# Patient Record
Sex: Male | Born: 1950
Health system: Southern US, Community
[De-identification: ages and names within clinical notes are randomized; demographics above are authoritative.]

## PROBLEM LIST (undated history)

## (undated) DIAGNOSIS — E7800 Pure hypercholesterolemia, unspecified: Secondary | ICD-10-CM

## (undated) DIAGNOSIS — I10 Essential (primary) hypertension: Secondary | ICD-10-CM

## (undated) DIAGNOSIS — K648 Other hemorrhoids: Secondary | ICD-10-CM

## (undated) DIAGNOSIS — K635 Polyp of colon: Secondary | ICD-10-CM

## (undated) DIAGNOSIS — I2510 Atherosclerotic heart disease of native coronary artery without angina pectoris: Secondary | ICD-10-CM

## (undated) DIAGNOSIS — K5790 Diverticulosis of intestine, part unspecified, without perforation or abscess without bleeding: Secondary | ICD-10-CM

## (undated) DIAGNOSIS — Z87442 Personal history of urinary calculi: Secondary | ICD-10-CM

## (undated) HISTORY — DX: Diverticulosis of intestine, part unspecified, without perforation or abscess without bleeding: K57.90

## (undated) HISTORY — DX: Polyp of colon: K63.5

## (undated) HISTORY — DX: Other hemorrhoids: K64.8

---

## 2011-09-18 ENCOUNTER — Ambulatory Visit (INDEPENDENT_AMBULATORY_CARE_PROVIDER_SITE_OTHER): Payer: BC Managed Care – PPO | Admitting: Family Medicine

## 2011-09-18 VITALS — BP 170/94 | HR 85 | Temp 98.8°F | Resp 16 | Ht 67.5 in | Wt 233.0 lb

## 2011-09-18 DIAGNOSIS — R21 Rash and other nonspecific skin eruption: Secondary | ICD-10-CM

## 2011-09-18 LAB — POCT CBC
Granulocyte percent: 57.2 %G (ref 37–80)
HCT, POC: 47 % (ref 43.5–53.7)
Hemoglobin: 15.1 g/dL (ref 14.1–18.1)
POC Granulocyte: 4.5 (ref 2–6.9)
RDW, POC: 13.1 %

## 2011-09-18 LAB — POCT SKIN KOH: Skin KOH, POC: NEGATIVE

## 2011-09-18 MED ORDER — TRIAMCINOLONE ACETONIDE 0.1 % EX CREA: TOPICAL_CREAM | Freq: Two times a day (BID) | CUTANEOUS | Status: AC

## 2011-09-18 NOTE — Patient Instructions (Addendum)
Work hard on weight loss.  Monitor your blood pressure to make sure it stays below 140/90.

## 2011-09-18 NOTE — Progress Notes (Signed)
Subjective: 61 year old man who has been having a rash on his lower legs for the last 10 days. It is fairly asymptomatic. He has red areas on the middle portion of both ankles, as [place] is little dots on his feet and legs. He did get a tick bite a few weeks back, but has not been sick with a fever or any systemic type of. He feels fine.  Objective: Tiny little red dots on both legs, left worse than the right. Around the medial malleolus of both feet is a round plaque-like lesion that head is very well redness. There is mild blanching of this area. No rash on the soles of the feet.  Assessment: Atypical dermatitis  Plan: Do a skin scraping and check a CBC to be sure his platelets are okay.  Results for orders placed in visit on 09/18/11  POCT CBC      Component Value Range   WBC 7.9  4.6 - 10.2 K/uL   Lymph, poc 2.6  0.6 - 3.4   POC LYMPH PERCENT 33.1  10 - 50 %L   MID (cbc) 0.8  0 - 0.9   POC MID % 9.7  0 - 12 %M   POC Granulocyte 4.5  2 - 6.9   Granulocyte percent 57.2  37 - 80 %G   RBC 4.79  4.69 - 6.13 M/uL   Hemoglobin 15.1  14.1 - 18.1 g/dL   HCT, POC 54.0  98.1 - 53.7 %   MCV 98.2 (*) 80 - 97 fL   MCH, POC 31.5 (*) 27 - 31.2 pg   MCHC 32.1  31.8 - 35.4 g/dL   RDW, POC 19.1     Platelet Count, POC 270  142 - 424 K/uL   MPV 9.4  0 - 99.8 fL  POCT SKIN KOH      Component Value Range   Skin KOH, POC Negative     With normal skin scraping and platelet count, believe this is just a nonspecific allergic dermatitis. Will treat with triamcinolone cream.  Patient is to monitor his blood pressure and if it continues to run over 140/90 he is to come back for further assessment.

## 2013-09-28 DIAGNOSIS — Z0271 Encounter for disability determination: Secondary | ICD-10-CM

## 2017-11-11 ENCOUNTER — Encounter: Payer: Self-pay | Admitting: Internal Medicine

## 2017-11-11 ENCOUNTER — Ambulatory Visit (INDEPENDENT_AMBULATORY_CARE_PROVIDER_SITE_OTHER): Payer: Medicare Other | Admitting: Internal Medicine

## 2017-11-11 VITALS — BP 146/90 | HR 84 | Ht 69.0 in | Wt 235.2 lb

## 2017-11-11 VITALS — BP 148/98 | HR 75 | Ht 69.0 in | Wt 234.0 lb

## 2017-11-11 DIAGNOSIS — R079 Chest pain, unspecified: Secondary | ICD-10-CM

## 2017-11-11 DIAGNOSIS — Z131 Encounter for screening for diabetes mellitus: Secondary | ICD-10-CM

## 2017-11-11 DIAGNOSIS — Z125 Encounter for screening for malignant neoplasm of prostate: Secondary | ICD-10-CM

## 2017-11-11 DIAGNOSIS — I200 Unstable angina: Secondary | ICD-10-CM | POA: Diagnosis not present

## 2017-11-11 DIAGNOSIS — R030 Elevated blood-pressure reading, without diagnosis of hypertension: Secondary | ICD-10-CM | POA: Diagnosis not present

## 2017-11-11 DIAGNOSIS — Z13220 Encounter for screening for lipoid disorders: Secondary | ICD-10-CM | POA: Diagnosis not present

## 2017-11-11 NOTE — Patient Instructions (Signed)
We have made you an appointment with Dr Debara Pickett today at 2:45pm.    Please take it easy today.

## 2017-11-11 NOTE — Progress Notes (Signed)
   Steven Larsen 67 y.o. [birth year] [id number]  Assessment & Plan:   Encounter Diagnoses  Name Primary?  . Exertional chest pain - burning Yes  . Elevated blood-pressure reading without diagnosis of hypertension     This sounds like exertional angina to me.  He needs a cardiology evaluation and Dr. Dorris Carnes is kindly help me arrange for an appointment with Dr. Debara Pickett today, this afternoon.  I did go ahead and give him 325 mg of aspirin today though I do not think he has an acute coronary syndrome I thought it could not hurt.  Further plans per cardiology.  I appreciate the opportunity to care for this patient. Copy to Dr. Mali Hilty     Subjective:   Chief Complaint: Heartburn  HPI The patient is here with his wife having 1 to 2 weeks of new exertional burning in the mid chest into the neck.  He noticed it when he was mowing with a push mower on a grade.  He is never had this before.  He has had 4 or 5 self-limited episodes relieved by rest in the last 1 to 2 weeks.  He steadfastly denies any rest pain or symptoms at all.  His wife thought he might be having reflux because she seen a little bit of a brownish phlegm on his pillow from some reflux of regurgitation that she sees from time to time but that it was typically clear.  He does not have any problems with eating nausea vomiting sweating dyspnea.  He has not seen a physician in about 5 years.  He used to take a daily 81 mg aspirin but stopped 6 months ago based upon the recent published information.  Mild pedal edema at times.  All other review of systems appears negative at this time as far as GI. No Known Allergies Current Meds  Medication Sig  . OVER THE COUNTER MEDICATION Take by mouth daily. Super Beta Prostate   Past Medical History:  Diagnosis Date  . Kidney stones    History reviewed. No pertinent surgical history. Social History   Social History Narrative   Married second time, 1 son and 3 daughters biologic  and stepchildren   Owner of [employer]   Never smoker   No alcohol   3 caffeinated beverages daily   family history includes Prostate cancer in his father.   Review of Systems As per HPI all other review of systems negative  Objective:   Physical Exam @BP  (!) 146/90   Pulse 84   Ht 5\' 9"  (1.753 m)   Wt 235 lb 4 oz (106.7 kg)   BMI 34.74 kg/m @  General:  Well-developed, well-nourished and in no acute distress he is obese Eyes:  anicteric. Neck:   supple w/o thyromegaly or mass.  Lungs: Clear to auscultation bilaterally. Heart:   S1S2, no rubs, murmurs, gallops.  Carotid without bruits, left carotid upstroke slightly lower than right but seen normal good radial pulses Abdomen: Obese, soft, non-tender, no hepatosplenomegaly, hernia, or mass and BS+.   Lymph:  no cervical or supraclavicular adenopathy. Extremities:   Trace ankle edema, no cyanosis or clubbing Skin   no rash. Neuro:  A&O x 3.  Psych:  appropriate mood and  Affect.

## 2017-11-11 NOTE — Patient Instructions (Signed)
Dr. Debara Pickett recommends that you take Aspirin 81mg  every day.  Your physician recommends that you schedule a follow-up appointment in about 3-4 weeks after heart cath with either MD or NP/PA    [place] [address] Dept: [phone] Loc: [place]  11/11/2017  You are scheduled for a Cardiac Catheterization on Wednesday, September 4 with Dr. Shelva Majestic.  1. Please arrive at the [facility] (Main Entrance A) at [facility]: [address] [place], [place] [address] at 6:30 AM (This time is two hours before your procedure to ensure your preparation). Free valet parking service is available.   Special note: Every effort is made to have your procedure done on time. Please understand that emergencies sometimes delay scheduled procedures.  2. Diet: Do not eat solid foods after midnight.  You may have clear liquids (jello, broth, water, apple juice for example) until 5am upon the day of the procedure.  3. Labs & Chest X-Ray: -- lab work tomorrow August 29th or Friday August 30th - you will need to be fasting  (no food/drink after midnight - water is OK) -- chest x-ray @ [address] - done by Friday August 30th  4. Medication instructions in preparation for your procedure:  On the morning of your procedure, take Aspirin 81mg  and any morning medicines NOT listed above.  You may use sips of water.  5. Plan for one night stay--bring personal belongings. 6. Bring a current list of your medications and current insurance cards. 7. You MUST have a responsible person to drive you home. 8. Someone MUST be with you the first 24 hours after you arrive home or your discharge will be delayed. 9. Please wear clothes that are easy to get on and off and wear slip-on shoes.  Thank you for allowing [place] to care for you!   -- [facility]

## 2017-11-12 ENCOUNTER — Encounter: Payer: Self-pay | Admitting: Internal Medicine

## 2017-11-12 ENCOUNTER — Ambulatory Visit
Admission: RE | Admit: 2017-11-12 | Discharge: 2017-11-12 | Disposition: A | Discharge status: Home / Self Care | Payer: BLUE CROSS/BLUE SHIELD | Source: Ambulatory Visit | Attending: Internal Medicine | Admitting: Internal Medicine

## 2017-11-12 ENCOUNTER — Other Ambulatory Visit: Payer: Self-pay | Admitting: *Deleted

## 2017-11-12 DIAGNOSIS — Z131 Encounter for screening for diabetes mellitus: Secondary | ICD-10-CM | POA: Diagnosis not present

## 2017-11-12 DIAGNOSIS — Z125 Encounter for screening for malignant neoplasm of prostate: Secondary | ICD-10-CM | POA: Diagnosis not present

## 2017-11-12 DIAGNOSIS — R7301 Impaired fasting glucose: Secondary | ICD-10-CM | POA: Diagnosis not present

## 2017-11-12 DIAGNOSIS — Z01818 Encounter for other preprocedural examination: Secondary | ICD-10-CM | POA: Diagnosis not present

## 2017-11-12 DIAGNOSIS — I200 Unstable angina: Secondary | ICD-10-CM | POA: Diagnosis not present

## 2017-11-12 DIAGNOSIS — Z13220 Encounter for screening for lipoid disorders: Secondary | ICD-10-CM | POA: Diagnosis not present

## 2017-11-12 NOTE — Progress Notes (Signed)
OFFICE CONSULT NOTE  Chief Complaint:  Exertional dyspnea, chest discomfort  Primary Care Physician: Patient, No Pcp Per  HPI:  Steven Larsen is a 67 y.o. male who is being seen today for the evaluation of exertional dyspnea, chest discomfort at the request of Dr. Carlean Purl. 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  Recently he was push mowing the yard and noted onset of chest tightness and burning which resolved at rest.  The symptoms then came back when he started working again.  This happened several times and then eventually stopped.  There was originally concern for possible reflux and he was seen by Dr. Arelia Longest with [facility].  Because of the exertional nature of his symptoms, he was concerned that this may represent unstable angina.  [name], has not had any routine medical care over the past several years.  He does not currently take medications or carry any outstanding diagnoses.  Family history is not significant for early onset heart disease, mostly notable for cancer in his father.  He reports taking some reflux medications without any benefit.  Blood pressure today noted to be elevated in the upper 140s over 90s.  He is not known to have hypertension.  PMHx:  Past Medical History:  Diagnosis Date  . Kidney stones     No past surgical history on file.  FAMHx:  Family History  Problem Relation Age of Onset  . Prostate cancer Father     SOCHx:   reports that he has never smoked. He has never used smokeless tobacco. He reports that he does not drink alcohol or use drugs.  ALLERGIES:  No Known Allergies  ROS: Pertinent items noted in HPI and remainder of comprehensive ROS otherwise negative.  HOME MEDS: Current Outpatient Medications on File Prior to Visit  Medication Sig Dispense Refill  . aspirin EC 81 MG  tablet Take 81 mg by mouth every other day.     Marland Kitchen OVER THE COUNTER MEDICATION Take 1 capsule by mouth daily. Super Beta Prostate      No current facility-administered medications on file prior to visit.     LABS/IMAGING: No results found for this or any previous visit (from the past 48 hour(s)). No results found.  LIPID PANEL: No results found for: CHOL, TRIG, HDL, CHOLHDL, VLDL, LDLCALC, LDLDIRECT  WEIGHTS: Wt Readings from Last 3 Encounters:  11/11/17 234 lb (106.1 kg)  11/11/17 235 lb 4 oz (106.7 kg)  09/18/11 233 lb (105.7 kg)    VITALS: BP (!) 148/98 (BP Location: Right Arm, Patient Position: Sitting, Cuff Size: Normal)   Pulse 75   Ht 5\' 9"  (1.753 m)   Wt 234 lb (106.1 kg)   BMI 34.56 kg/m   EXAM: General appearance: alert and no distress Neck: no carotid bruit, no JVD and thyroid not enlarged, symmetric, no tenderness/mass/nodules Lungs: clear to auscultation bilaterally Heart: regular rate and rhythm, S1, S2 normal, no murmur, click, rub or gallop Abdomen: soft, non-tender; bowel sounds normal; no masses,  no organomegaly and obese Extremities: extremities normal, atraumatic, no cyanosis or edema Pulses: 2+ and symmetric Skin: Skin color, texture, turgor normal. No rashes or lesions Neurologic: Grossly normal Psych: Pleasant  EKG: Sinus rhythm with PVCs at 76- personally reviewed  ASSESSMENT: 1. Exertional chest pain-concerning for unstable angina  PLAN: 1.  [name] is describing exertional chest pain which was worse when mowing his lawn and has been accelerating over the past couple of weeks, concerning for unstable angina.  His EKG is unremarkable for ischemia however noted a PVC.  His blood pressure has been noted to be elevated and may require treatment.  I recommend starting aspirin 81 mg daily.  He reported he actually took aspirin for a number of years but stopped it several years ago due to some conflicting reports for primary prevention.  We  discussed possible ways to diagnose his symptoms including noninvasive and invasive testing, but given a higher pretest probability based on his symptom pattern, I would recommend direct left heart catheterization.  He is in agreement with this.  I discussed both the risks, benefits and alternatives with him and his wife today in the office as well as the procedure in detail, and he is agreeable to proceed.  We will try to schedule it as soon as possible, likely next week.  Thanks again for the kind referral.  He will follow-up with me after his heart cath.  Pixie Casino, MD, [facility], [place] Director of the Advanced Lipid Disorders &  Cardiovascular Risk Reduction Clinic Diplomate of the American Board of Clinical Lipidology Attending Cardiologist  Direct Dial: [phone]  Fax: [phone]  Website:  [email or web address] 11/12/2017, 1:02 PM

## 2017-11-12 NOTE — H&P (View-Only) (Signed)
OFFICE CONSULT NOTE  Chief Complaint:  Exertional dyspnea, chest discomfort  Primary Care Physician: Patient, No Pcp Per  HPI:  Steven Larsen is a 67 y.o. male who is being seen today for the evaluation of exertional dyspnea, chest discomfort at the request of Dr. Carlean Purl. 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  Recently he was push mowing the yard and noted onset of chest tightness and burning which resolved at rest.  The symptoms then came back when he started working again.  This happened several times and then eventually stopped.  There was originally concern for possible reflux and he was seen by Dr. Arelia Longest with [facility].  Because of the exertional nature of his symptoms, he was concerned that this may represent unstable angina.  Mr. Rallis, has not had any routine medical care over the past several years.  He does not currently take medications or carry any outstanding diagnoses.  Family history is not significant for early onset heart disease, mostly notable for cancer in his father.  He reports taking some reflux medications without any benefit.  Blood pressure today noted to be elevated in the upper 140s over 90s.  He is not known to have hypertension.  PMHx:  Past Medical History:  Diagnosis Date  . Kidney stones     No past surgical history on file.  FAMHx:  Family History  Problem Relation Age of Onset  . Prostate cancer Father     SOCHx:   reports that he has never smoked. He has never used smokeless tobacco. He reports that he does not drink alcohol or use drugs.  ALLERGIES:  No Known Allergies  ROS: Pertinent items noted in HPI and remainder of comprehensive ROS otherwise negative.  HOME MEDS: Current Outpatient Medications on File Prior to Visit  Medication Sig Dispense Refill  . aspirin EC 81 MG  tablet Take 81 mg by mouth every other day.     Marland Kitchen OVER THE COUNTER MEDICATION Take 1 capsule by mouth daily. Super Beta Prostate      No current facility-administered medications on file prior to visit.     LABS/IMAGING: No results found for this or any previous visit (from the past 48 hour(s)). No results found.  LIPID PANEL: No results found for: CHOL, TRIG, HDL, CHOLHDL, VLDL, LDLCALC, LDLDIRECT  WEIGHTS: Wt Readings from Last 3 Encounters:  11/11/17 234 lb (106.1 kg)  11/11/17 235 lb 4 oz (106.7 kg)  09/18/11 233 lb (105.7 kg)    VITALS: BP (!) 148/98 (BP Location: Right Arm, Patient Position: Sitting, Cuff Size: Normal)   Pulse 75   Ht 5\' 9"  (1.753 m)   Wt 234 lb (106.1 kg)   BMI 34.56 kg/m   EXAM: General appearance: alert and no distress Neck: no carotid bruit, no JVD and thyroid not enlarged, symmetric, no tenderness/mass/nodules Lungs: clear to auscultation bilaterally Heart: regular rate and rhythm, S1, S2 normal, no murmur, click, rub or gallop Abdomen: soft, non-tender; bowel sounds normal; no masses,  no organomegaly and obese Extremities: extremities normal, atraumatic, no cyanosis or edema Pulses: 2+ and symmetric Skin: Skin color, texture, turgor normal. No rashes or lesions Neurologic: Grossly normal Psych: Pleasant  EKG: Sinus rhythm with PVCs at 76- personally reviewed  ASSESSMENT: 1. Exertional chest pain-concerning for unstable angina  PLAN: 1.  Mr. Macaraeg is describing exertional chest pain which was worse when mowing his lawn and has been accelerating over the past couple of weeks, concerning for unstable angina.  His EKG is unremarkable for ischemia however noted a PVC.  His blood pressure has been noted to be elevated and may require treatment.  I recommend starting aspirin 81 mg daily.  He reported he actually took aspirin for a number of years but stopped it several years ago due to some conflicting reports for primary prevention.  We  discussed possible ways to diagnose his symptoms including noninvasive and invasive testing, but given a higher pretest probability based on his symptom pattern, I would recommend direct left heart catheterization.  He is in agreement with this.  I discussed both the risks, benefits and alternatives with him and his wife today in the office as well as the procedure in detail, and he is agreeable to proceed.  We will try to schedule it as soon as possible, likely next week.  Thanks again for the kind referral.  He will follow-up with me after his heart cath.  Pixie Casino, MD, [facility], [place] Director of the Advanced Lipid Disorders &  Cardiovascular Risk Reduction Clinic Diplomate of the American Board of Clinical Lipidology Attending Cardiologist  Direct Dial: [phone]  Fax: [phone]  Website:  [email or web address] 11/12/2017, 1:02 PM

## 2017-11-13 LAB — COMPREHENSIVE METABOLIC PANEL
ALBUMIN: 4.5 g/dL (ref 3.6–4.8)
ALK PHOS: 67 IU/L (ref 39–117)
ALT: 27 IU/L (ref 0–44)
AST: 22 IU/L (ref 0–40)
Albumin/Globulin Ratio: 1.8 (ref 1.2–2.2)
BUN / CREAT RATIO: 18 (ref 10–24)
BUN: 17 mg/dL (ref 8–27)
Bilirubin Total: 0.9 mg/dL (ref 0.0–1.2)
CALCIUM: 9.7 mg/dL (ref 8.6–10.2)
CO2: 25 mmol/L (ref 20–29)
Chloride: 102 mmol/L (ref 96–106)
Creatinine, Ser: 0.92 mg/dL (ref 0.76–1.27)
GFR calc non Af Amer: 87 mL/min/{1.73_m2} (ref >59)
GFR, EST AFRICAN AMERICAN: 101 mL/min/{1.73_m2} (ref >59)
GLUCOSE: 112 mg/dL — AB (ref 65–99)
Globulin, Total: 2.5 g/dL (ref 1.5–4.5)
Potassium: 4.7 mmol/L (ref 3.5–5.2)
Sodium: 139 mmol/L (ref 134–144)
TOTAL PROTEIN: 7 g/dL (ref 6.0–8.5)

## 2017-11-13 LAB — CBC
Hematocrit: 45.4 % (ref 37.5–51.0)
Hemoglobin: 15.7 g/dL (ref 13.0–17.7)
MCH: 31.7 pg (ref 26.6–33.0)
MCHC: 34.6 g/dL (ref 31.5–35.7)
MCV: 92 fL (ref 79–97)
PLATELETS: 228 10*3/uL (ref 150–450)
RBC: 4.95 x10E6/uL (ref 4.14–5.80)
RDW: 12.9 % (ref 12.3–15.4)
WBC: 6.5 10*3/uL (ref 3.4–10.8)

## 2017-11-13 LAB — HEMOGLOBIN A1C
ESTIMATED AVERAGE GLUCOSE: 128 mg/dL
HEMOGLOBIN A1C: 6.1 % — AB (ref 4.8–5.6)

## 2017-11-13 LAB — LIPID PANEL
Chol/HDL Ratio: 4.6 ratio (ref 0.0–5.0)
Cholesterol, Total: 222 mg/dL — ABNORMAL HIGH (ref 100–199)
HDL: 48 mg/dL (ref >39)
LDL Calculated: 151 mg/dL — ABNORMAL HIGH (ref 0–99)
Triglycerides: 114 mg/dL (ref 0–149)
VLDL Cholesterol Cal: 23 mg/dL (ref 5–40)

## 2017-11-13 LAB — PSA: Prostate Specific Ag, Serum: 1.7 ng/mL (ref 0.0–4.0)

## 2017-11-17 ENCOUNTER — Telehealth: Payer: Self-pay

## 2017-11-17 NOTE — Telephone Encounter (Signed)
Patient contacted pre-catheterization at [facility] scheduled for:  8:30 am on 11/18/2017 Verified arrival time and place:  6:30 am admitting Confirmed AM meds to be taken pre-cath with sip of water: Take ASA Confirmed patient has responsible person to drive home post procedure and observe patient for 24 hours: yes

## 2017-11-18 ENCOUNTER — Other Ambulatory Visit: Payer: Self-pay

## 2017-11-18 ENCOUNTER — Encounter (HOSPITAL_COMMUNITY): Payer: Self-pay | Admitting: Cardiovascular Disease

## 2017-11-18 ENCOUNTER — Encounter (HOSPITAL_COMMUNITY)
Admission: RE | Disposition: A | Discharge status: Home / Self Care | Payer: Self-pay | Source: Ambulatory Visit | Attending: Cardiovascular Disease

## 2017-11-18 ENCOUNTER — Ambulatory Visit (HOSPITAL_COMMUNITY)
Admission: RE | Admit: 2017-11-18 | Discharge: 2017-11-19 | Disposition: A | Discharge status: Home / Self Care | Payer: Medicare Other | Source: Ambulatory Visit | Attending: Cardiovascular Disease | Admitting: Cardiovascular Disease

## 2017-11-18 DIAGNOSIS — Z87442 Personal history of urinary calculi: Secondary | ICD-10-CM | POA: Insufficient documentation

## 2017-11-18 DIAGNOSIS — I200 Unstable angina: Secondary | ICD-10-CM | POA: Diagnosis present

## 2017-11-18 DIAGNOSIS — I493 Ventricular premature depolarization: Secondary | ICD-10-CM | POA: Diagnosis not present

## 2017-11-18 DIAGNOSIS — I25110 Atherosclerotic heart disease of native coronary artery with unstable angina pectoris: Secondary | ICD-10-CM

## 2017-11-18 DIAGNOSIS — Z7982 Long term (current) use of aspirin: Secondary | ICD-10-CM | POA: Diagnosis not present

## 2017-11-18 DIAGNOSIS — Z9889 Other specified postprocedural states: Secondary | ICD-10-CM | POA: Insufficient documentation

## 2017-11-18 DIAGNOSIS — Z955 Presence of coronary angioplasty implant and graft: Secondary | ICD-10-CM

## 2017-11-18 DIAGNOSIS — E785 Hyperlipidemia, unspecified: Secondary | ICD-10-CM

## 2017-11-18 DIAGNOSIS — I10 Essential (primary) hypertension: Secondary | ICD-10-CM

## 2017-11-18 HISTORY — DX: Atherosclerotic heart disease of native coronary artery without angina pectoris: I25.10

## 2017-11-18 HISTORY — DX: Pure hypercholesterolemia, unspecified: E78.00

## 2017-11-18 HISTORY — PX: LEFT HEART CATH AND CORONARY ANGIOGRAPHY: CATH118249

## 2017-11-18 HISTORY — DX: Essential (primary) hypertension: I10

## 2017-11-18 HISTORY — DX: Personal history of urinary calculi: Z87.442

## 2017-11-18 LAB — POCT ACTIVATED CLOTTING TIME
ACTIVATED CLOTTING TIME: 296 s
ACTIVATED CLOTTING TIME: 373 s

## 2017-11-18 LAB — CARDIAC CATHETERIZATION: CATHEFQUANT: 55 %

## 2017-11-18 SURGERY — LEFT HEART CATH AND CORONARY ANGIOGRAPHY
Anesthesia: LOCAL

## 2017-11-18 MED ORDER — SODIUM CHLORIDE 0.9 % IV SOLN
INTRAVENOUS | Status: AC
  Administered 2017-11-18: 16:00:00 via INTRAVENOUS

## 2017-11-18 MED ORDER — LIDOCAINE HCL (PF) 1 % IJ SOLN
INTRAMUSCULAR | Status: DC | PRN
  Administered 2017-11-18: 2 mL via INTRADERMAL

## 2017-11-18 MED ORDER — IOPAMIDOL (ISOVUE-370) INJECTION 76%
INTRAVENOUS | Status: AC
  Filled 2017-11-18: qty 100

## 2017-11-18 MED ORDER — LABETALOL HCL 5 MG/ML IV SOLN: 10.0000 mg | INTRAVENOUS | Status: AC | PRN

## 2017-11-18 MED ORDER — TICAGRELOR 90 MG PO TABS
ORAL_TABLET | ORAL | Status: AC
  Filled 2017-11-18: qty 2

## 2017-11-18 MED ORDER — ONDANSETRON HCL 4 MG/2ML IJ SOLN: 4.0000 mg | Freq: Four times a day (QID) | INTRAMUSCULAR | Status: DC | PRN

## 2017-11-18 MED ORDER — MIDAZOLAM HCL 2 MG/2ML IJ SOLN
INTRAMUSCULAR | Status: AC
  Filled 2017-11-18: qty 2

## 2017-11-18 MED ORDER — VERAPAMIL HCL 2.5 MG/ML IV SOLN
INTRAVENOUS | Status: AC
  Filled 2017-11-18: qty 2

## 2017-11-18 MED ORDER — FENTANYL CITRATE (PF) 100 MCG/2ML IJ SOLN
INTRAMUSCULAR | Status: DC | PRN
  Administered 2017-11-18: 50 ug via INTRAVENOUS
  Administered 2017-11-18: 25 ug via INTRAVENOUS

## 2017-11-18 MED ORDER — ACETAMINOPHEN 325 MG PO TABS: 650.0000 mg | ORAL_TABLET | ORAL | Status: DC | PRN

## 2017-11-18 MED ORDER — METOPROLOL TARTRATE 12.5 MG HALF TABLET
12.5000 mg | ORAL_TABLET | Freq: Two times a day (BID) | ORAL | Status: DC
  Administered 2017-11-18 (×2): 12.5 mg via ORAL
  Filled 2017-11-18 (×2): qty 1

## 2017-11-18 MED ORDER — FENTANYL CITRATE (PF) 100 MCG/2ML IJ SOLN
INTRAMUSCULAR | Status: AC
  Filled 2017-11-18: qty 2

## 2017-11-18 MED ORDER — ASPIRIN 81 MG PO CHEW: 81.0000 mg | CHEWABLE_TABLET | ORAL | Status: DC

## 2017-11-18 MED ORDER — SODIUM CHLORIDE 0.9% FLUSH
3.0000 mL | Freq: Two times a day (BID) | INTRAVENOUS | Status: DC
  Administered 2017-11-18: 3 mL via INTRAVENOUS

## 2017-11-18 MED ORDER — HYDRALAZINE HCL 20 MG/ML IJ SOLN: 5.0000 mg | INTRAMUSCULAR | Status: AC | PRN

## 2017-11-18 MED ORDER — SODIUM CHLORIDE 0.9 % IV SOLN: INTRAVENOUS | Status: AC

## 2017-11-18 MED ORDER — SODIUM CHLORIDE 0.9% FLUSH: 3.0000 mL | INTRAVENOUS | Status: DC | PRN

## 2017-11-18 MED ORDER — HEPARIN SODIUM (PORCINE) 1000 UNIT/ML IJ SOLN
INTRAMUSCULAR | Status: AC
  Filled 2017-11-18: qty 1

## 2017-11-18 MED ORDER — MIDAZOLAM HCL 2 MG/2ML IJ SOLN
INTRAMUSCULAR | Status: DC | PRN
  Administered 2017-11-18: 1 mg via INTRAVENOUS
  Administered 2017-11-18: 2 mg via INTRAVENOUS

## 2017-11-18 MED ORDER — TICAGRELOR 90 MG PO TABS
90.0000 mg | ORAL_TABLET | Freq: Two times a day (BID) | ORAL | Status: DC
  Administered 2017-11-18 – 2017-11-19 (×2): 90 mg via ORAL
  Filled 2017-11-18 (×2): qty 1

## 2017-11-18 MED ORDER — SODIUM CHLORIDE 0.9 % IV SOLN: 250.0000 mL | INTRAVENOUS | Status: DC | PRN

## 2017-11-18 MED ORDER — ATORVASTATIN CALCIUM 80 MG PO TABS
80.0000 mg | ORAL_TABLET | Freq: Every day | ORAL | Status: DC
  Administered 2017-11-18: 18:00:00 80 mg via ORAL
  Filled 2017-11-18: qty 1

## 2017-11-18 MED ORDER — TICAGRELOR 90 MG PO TABS
ORAL_TABLET | ORAL | Status: DC | PRN
  Administered 2017-11-18: 180 mg via ORAL

## 2017-11-18 MED ORDER — VERAPAMIL HCL 2.5 MG/ML IV SOLN
INTRAVENOUS | Status: DC | PRN
  Administered 2017-11-18: 10 mL via INTRA_ARTERIAL

## 2017-11-18 MED ORDER — IOPAMIDOL (ISOVUE-370) INJECTION 76%
INTRAVENOUS | Status: DC | PRN
  Administered 2017-11-18: 160 mL

## 2017-11-18 MED ORDER — NITROGLYCERIN 1 MG/10 ML FOR IR/CATH LAB
INTRA_ARTERIAL | Status: AC
  Filled 2017-11-18: qty 10

## 2017-11-18 MED ORDER — HEPARIN (PORCINE) IN NACL 1000-0.9 UT/500ML-% IV SOLN
INTRAVENOUS | Status: AC
  Filled 2017-11-18: qty 1000

## 2017-11-18 MED ORDER — ASPIRIN 81 MG PO CHEW
81.0000 mg | CHEWABLE_TABLET | Freq: Every day | ORAL | Status: DC
  Administered 2017-11-19: 81 mg via ORAL
  Filled 2017-11-18: qty 1

## 2017-11-18 MED ORDER — HEPARIN (PORCINE) IN NACL 1000-0.9 UT/500ML-% IV SOLN
INTRAVENOUS | Status: DC | PRN
  Administered 2017-11-18 (×2): 500 mL

## 2017-11-18 MED ORDER — NITROGLYCERIN 1 MG/10 ML FOR IR/CATH LAB
INTRA_ARTERIAL | Status: DC | PRN
  Administered 2017-11-18 (×3): 200 ug via INTRACORONARY

## 2017-11-18 MED ORDER — ANGIOPLASTY BOOK
Freq: Once | Status: DC
  Filled 2017-11-18: qty 1

## 2017-11-18 MED ORDER — SODIUM CHLORIDE 0.9 % IV SOLN
INTRAVENOUS | Status: DC
  Administered 2017-11-18: 07:00:00 via INTRAVENOUS

## 2017-11-18 MED ORDER — DIAZEPAM 5 MG PO TABS: 5.0000 mg | ORAL_TABLET | Freq: Four times a day (QID) | ORAL | Status: DC | PRN

## 2017-11-18 MED ORDER — HEPARIN SODIUM (PORCINE) 1000 UNIT/ML IJ SOLN
INTRAMUSCULAR | Status: DC | PRN
  Administered 2017-11-18: 5000 [IU] via INTRAVENOUS
  Administered 2017-11-18: 6000 [IU] via INTRAVENOUS
  Administered 2017-11-18: 1000 [IU] via INTRAVENOUS

## 2017-11-18 MED ORDER — LIDOCAINE HCL (PF) 1 % IJ SOLN
INTRAMUSCULAR | Status: AC
  Filled 2017-11-18: qty 30

## 2017-11-18 SURGICAL SUPPLY — 19 items
BALLN SAPPHIRE ~~LOC~~ 3.25X12 (BALLOONS) ×2 IMPLANT
BALLN WOLVERINE 2.00X6 (BALLOONS) ×2
BALLOON WOLVERINE 2.00X6 (BALLOONS) ×1 IMPLANT
CATH INFINITI 5FR ANG PIGTAIL (CATHETERS) ×2 IMPLANT
CATH OPTITORQUE TIG 4.0 5F (CATHETERS) ×2 IMPLANT
CATH VISTA GUIDE 6FR XBLAD3.5 (CATHETERS) ×2 IMPLANT
DEVICE RAD COMP TR BAND LRG (VASCULAR PRODUCTS) ×2 IMPLANT
GLIDESHEATH SLEND SS 6F .021 (SHEATH) ×2 IMPLANT
GUIDEWIRE INQWIRE 1.5J.035X260 (WIRE) ×1 IMPLANT
INQWIRE 1.5J .035X260CM (WIRE) ×2
KIT ENCORE 26 ADVANTAGE (KITS) ×2 IMPLANT
KIT HEART LEFT (KITS) ×2 IMPLANT
PACK CARDIAC CATHETERIZATION (CUSTOM PROCEDURE TRAY) ×2 IMPLANT
STENT RESOLUTE ONYX 3.0X15 (Permanent Stent) ×2 IMPLANT
SYR MEDRAD MARK V 150ML (SYRINGE) ×2 IMPLANT
TRANSDUCER W/STOPCOCK (MISCELLANEOUS) ×2 IMPLANT
TUBING CIL FLEX 10 FLL-RA (TUBING) ×2 IMPLANT
WIRE ASAHI PROWATER 180CM (WIRE) ×2 IMPLANT
WIRE COUGAR XT STRL 190CM (WIRE) ×2 IMPLANT

## 2017-11-18 NOTE — Care Management (Signed)
11-18-17  BENEFITS CHECK :   # 3.  S/W KAREN @ [employer] [employer] [facility] PDP # [id number]   BRILINTA   90 MG BID COVER- YES CO-PAY- $ 378.62 TIER- 3 DRUG PRIOR APPROVAL- NO  DEDUCTIBLE: NOT MET  PREFERRED PHARMACY : YES   [facility]

## 2017-11-18 NOTE — Progress Notes (Signed)
TR BAND REMOVAL  LOCATION:    right radial  DEFLATED PER PROTOCOL:    Yes.    TIME BAND OFF / DRESSING APPLIED:    1345   SITE UPON ARRIVAL:    Level 0  SITE AFTER BAND REMOVAL:    Level 0  CIRCULATION SENSATION AND MOVEMENT:    Within Normal Limits   Yes.    COMMENTS:   Tolerated procedure well 

## 2017-11-18 NOTE — Interval H&P Note (Signed)
Cath Lab Visit (complete for each Cath Lab visit)  Clinical Evaluation Leading to the Procedure:   ACS: No.  Non-ACS:    Anginal Classification: CCS II  Anti-ischemic medical therapy: No Therapy  Non-Invasive Test Results: No non-invasive testing performed  Prior CABG: No previous CABG      History and Physical Interval Note:  11/18/2017 8:30 AM  Steven Larsen  has presented today for surgery, with the diagnosis of ua  The various methods of treatment have been discussed with the patient and family. After consideration of risks, benefits and other options for treatment, the patient has consented to  Procedure(s): LEFT HEART CATH AND CORONARY ANGIOGRAPHY (N/A) as a surgical intervention .  The patient's history has been reviewed, patient examined, no change in status, stable for surgery.  I have reviewed the patient's chart and labs.  Questions were answered to the patient's satisfaction.     Shelva Majestic

## 2017-11-18 NOTE — Research (Signed)
CADFEM Informed Consent   Subject Name: Steven [facility]en  Subject met inclusion and exclusion criteria.  The informed consent form, study requirements and expectations were reviewed with the subject and questions and concerns were addressed prior to the signing of the consent form.  The subject verbalized understanding of the trail requirements.  The subject agreed to participate in the CADFEM trial and signed the informed consent.  The informed consent was obtained prior to performance of any protocol-specific procedures for the subject.  A copy of the signed informed consent was given to the subject and a copy was placed in the subject's medical record.  [name] 11/18/2017, 8:20 AM

## 2017-11-19 ENCOUNTER — Encounter (HOSPITAL_COMMUNITY): Payer: Self-pay | Admitting: Cardiology

## 2017-11-19 DIAGNOSIS — I493 Ventricular premature depolarization: Secondary | ICD-10-CM | POA: Diagnosis not present

## 2017-11-19 DIAGNOSIS — Z87442 Personal history of urinary calculi: Secondary | ICD-10-CM | POA: Diagnosis not present

## 2017-11-19 DIAGNOSIS — E785 Hyperlipidemia, unspecified: Secondary | ICD-10-CM

## 2017-11-19 DIAGNOSIS — I10 Essential (primary) hypertension: Secondary | ICD-10-CM | POA: Diagnosis not present

## 2017-11-19 DIAGNOSIS — Z7982 Long term (current) use of aspirin: Secondary | ICD-10-CM | POA: Diagnosis not present

## 2017-11-19 DIAGNOSIS — E7800 Pure hypercholesterolemia, unspecified: Secondary | ICD-10-CM

## 2017-11-19 DIAGNOSIS — I200 Unstable angina: Secondary | ICD-10-CM

## 2017-11-19 DIAGNOSIS — I25110 Atherosclerotic heart disease of native coronary artery with unstable angina pectoris: Secondary | ICD-10-CM | POA: Diagnosis not present

## 2017-11-19 DIAGNOSIS — Z9889 Other specified postprocedural states: Secondary | ICD-10-CM | POA: Diagnosis not present

## 2017-11-19 LAB — CBC
HEMATOCRIT: 42.9 % (ref 39.0–52.0)
Hemoglobin: 14.3 g/dL (ref 13.0–17.0)
MCH: 31.6 pg (ref 26.0–34.0)
MCHC: 33.3 g/dL (ref 30.0–36.0)
MCV: 94.9 fL (ref 78.0–100.0)
Platelets: 182 10*3/uL (ref 150–400)
RBC: 4.52 MIL/uL (ref 4.22–5.81)
RDW: 12.6 % (ref 11.5–15.5)
WBC: 8.6 10*3/uL (ref 4.0–10.5)

## 2017-11-19 LAB — BASIC METABOLIC PANEL
Anion gap: 7 (ref 5–15)
BUN: 17 mg/dL (ref 8–23)
CALCIUM: 8.5 mg/dL — AB (ref 8.9–10.3)
CO2: 22 mmol/L (ref 22–32)
Chloride: 109 mmol/L (ref 98–111)
Creatinine, Ser: 0.93 mg/dL (ref 0.61–1.24)
GFR calc Af Amer: >60 mL/min (ref >60)
GLUCOSE: 148 mg/dL — AB (ref 70–99)
POTASSIUM: 3.7 mmol/L (ref 3.5–5.1)
Sodium: 138 mmol/L (ref 135–145)

## 2017-11-19 MED ORDER — TICAGRELOR 90 MG PO TABS: 90.0000 mg | ORAL_TABLET | Freq: Two times a day (BID) | ORAL | 2 refills | Status: DC

## 2017-11-19 MED ORDER — NITROGLYCERIN 0.4 MG SL SUBL: 0.4000 mg | SUBLINGUAL_TABLET | SUBLINGUAL | 0 refills | Status: DC | PRN

## 2017-11-19 MED ORDER — ATORVASTATIN CALCIUM 80 MG PO TABS: 80.0000 mg | ORAL_TABLET | Freq: Every day | ORAL | 2 refills | Status: DC

## 2017-11-19 MED ORDER — METOPROLOL TARTRATE 25 MG PO TABS
25.0000 mg | ORAL_TABLET | Freq: Two times a day (BID) | ORAL | Status: DC
  Administered 2017-11-19: 25 mg via ORAL
  Filled 2017-11-19: qty 1

## 2017-11-19 MED ORDER — METOPROLOL SUCCINATE ER 50 MG PO TB24: 50.0000 mg | ORAL_TABLET | Freq: Every day | ORAL | 2 refills | Status: DC

## 2017-11-19 MED FILL — BRILINTA 90 MG TABLET: 90 | 30 days supply | Qty: 60 | Fill #0

## 2017-11-19 MED FILL — ATORVASTATIN CALCIUM 80 MG: 80 | 30 days supply | Qty: 30 | Fill #0

## 2017-11-19 MED FILL — METOPROLOL SUCC ER 50 MG TA: 50 | 30 days supply | Qty: 30 | Fill #0

## 2017-11-19 MED FILL — NITROGLYCERIN 0.4 MG TAB SL: 0.4 | 25 days supply | Qty: 25 | Fill #0

## 2017-11-19 NOTE — Progress Notes (Signed)
CARDIAC REHAB PHASE I   PRE:  Rate/Rhythm: 48 SR PACs  BP:  Supine:   Sitting: 179/96  Standing:    SaO2:   MODE:  Ambulation: 800 ft   POST:  Rate/Rhythm: 102 ST PACs  BP:  Supine:   Sitting: 187/96  Standing:    SaO2:  0810-0902 Pt walked 800 ft with steady gait and no CP.  Tolerated well. Education completed with pt and wife who voiced understanding. Stressed importance of brilinta. Reviewed NTG use, ex ed, gave heart healthy diet. Told pt his A1C at 6.1 and needs to watch carbs and ex and keep up with Medical doctor to monitor. Discussed CRP 2 and referred to [place].   Graylon Good, RN BSN  11/19/2017 8:59 AM

## 2017-11-19 NOTE — Discharge Summary (Addendum)
Discharge Summary    Patient ID: Steven Larsen,  MRN: [id number], DOB/AGE: [birth year] 67 y.o.  Admit date: 11/18/2017 Discharge date: 11/19/2017  Primary Care Provider: Patient, No Pcp Per Primary Cardiologist: Dr. Debara Pickett   Discharge Diagnoses    Principal Problem:   Unstable angina [facility]) Active Problems:   Hyperlipidemia   Hypertension   Allergies No Known Allergies  Diagnostic Studies/Procedures    Cath: 11/18/17   Prox LAD to Mid LAD lesion is 95% stenosed.  Ost LAD to Prox LAD lesion is 30% stenosed.  Prox LAD-1 lesion is 20% stenosed.  Prox LAD-2 lesion is 30% stenosed.  A stent was successfully placed.  Post intervention, there is a 0% residual stenosis.  Post intervention, there is a 0% residual stenosis.  The left ventricular systolic function is normal.  LV end diastolic pressure is normal.   Single-vessel coronary obstructive disease in a dominant left coronary system.  There is 30% smooth proximal LAD stenosis before the takeoff of the first diagonal vessel.  There is a 20% stenosis immediately after the first small diagonal vessel.  In the mid LAD there was 30% proximal and 95% eccentric stenosis immediately after the second diagonal vessel.  The LAD was large caliber wrapped around the apex.  The ramus intermediate, dominant left circumflex vessel, and nondominant RCA vessels were normal.  Global LV function with an ejection fraction at 55%.  LVEDP 13 mm.  There were no focal segmental wall motion abnormalities.  RECOMMENDATION: The patient will be started on dual antiplatelet therapy with aspirin and Brilinta.  High potency statin therapy will be initiated.  The patient will also be started on beta-blocker therapy.  Optimal blood pressure with target less than 130/80 will be necessary.  Recommend uninterrupted dual antiplatelet therapy with Aspirin 81mg  daily and Ticagrelor 90mg  twice daily for a minimum of 6 months (stable ischemic heart  disease - Class I recommendation) but if tolerated would continue for 1 year. _____________   History of Present Illness     67 y.o. male who was referred to cardiology for the evaluation of exertional dyspnea, chest discomfort at the request of Dr. Carlean Purl. 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  Recently he was push mowing the yard and noted onset of chest tightness and burning which resolved at rest.  The symptoms then came back when he started working again.  This happened several times and then eventually stopped.  There was originally concern for possible reflux and he was seen by Dr. Carlean Purl with [facility].  Because of the exertional nature of his symptoms, he was concerned that this may represent unstable angina.  Mr. Steven Larsen, has not had any routine medical care over the past several years.  He did not currently take medications or carry any outstanding diagnoses.  Family history was not significant for early onset heart disease, mostly notable for cancer in his father.  He reported taking some reflux medications without any benefit.  Blood pressure at his office visit was noted to be elevated in the upper 140s over 90s.  He was not known to have hypertension. Given his symptoms he was set up for outpatient cardiac cath.  Hospital Course     Underwent cardiac cath noted above with single vessel CAD in the mLAD with 95% stenosis that was treated with  PCI/DESx1. LV gram noted noted EF. Plan for DAPT with ASA/Brilinta for at least 6 months, but preferable a year. Also added high dose statin and metoprolol post cath. LDL noted at 151 prior to cath. No recurrent chest pain. Worked well with cardiac rehab. Of note medications were given to the patient prior to discharge through the transition of care Pharm.   General: Well developed, well nourished, male  appearing in no acute distress. Head: Normocephalic, atraumatic.  Neck: Supple without bruits, JVD. Lungs:  Resp regular and unlabored, CTA. Heart: RRR, S1, S2, no S3, S4, or murmur; no rub. Abdomen: Soft, non-tender, non-distended with normoactive bowel sounds. No hepatomegaly. No rebound/guarding. No obvious abdominal masses. Extremities: No clubbing, cyanosis, edema. Distal pedal pulses are 2+ bilaterally. R radial cath site stable without bruising or hematoma Neuro: Alert and oriented X 3. Moves all extremities spontaneously. Psych: Normal affect.  Steven Larsen was seen by Dr. Gwenlyn Found and determined stable for discharge home. Follow up in the office has been arranged. Medications are listed below.   _____________  Discharge Vitals Blood pressure (!) 156/81, pulse 70, temperature (!) 97.4 F (36.3 C), resp. rate 14, height 5\' 10"  (1.778 m), weight 107 kg, SpO2 99 %.  Filed Weights   11/18/17 0635 11/19/17 0639  Weight: 106.6 kg 107 kg    Labs & Radiologic Studies    CBC Recent Labs    11/19/17 0308  WBC 8.6  HGB 14.3  HCT 42.9  MCV 94.9  PLT 237   Basic Metabolic Panel Recent Labs    11/19/17 0308  NA 138  K 3.7  CL 109  CO2 22  GLUCOSE 148*  BUN 17  CREATININE 0.93  CALCIUM 8.5*   Liver Function Tests No results for input(s): AST, ALT, ALKPHOS, BILITOT, PROT, ALBUMIN in the last 72 hours. No results for input(s): LIPASE, AMYLASE in the last 72 hours. Cardiac Enzymes No results for input(s): CKTOTAL, CKMB, CKMBINDEX, TROPONINI in the last 72 hours. BNP Invalid input(s): POCBNP D-Dimer No results for input(s): DDIMER in the last 72 hours. Hemoglobin A1C No results for input(s): HGBA1C in the last 72 hours. Fasting Lipid Panel No results for input(s): CHOL, HDL, LDLCALC, TRIG, CHOLHDL, LDLDIRECT in the last 72 hours. Thyroid Function Tests No results for input(s): TSH, T4TOTAL, T3FREE, THYROIDAB in the last 72 hours.  Invalid input(s):  FREET3 _____________  Dg Chest 2 View  Result Date: 11/12/2017 CLINICAL DATA:  Preoperative examination. Patient for cardiac catheterization. EXAM: CHEST - 2 VIEW COMPARISON:  None. FINDINGS: The lungs are clear. Heart size is normal. No pneumothorax or pleural effusion. No acute or focal bony abnormality. IMPRESSION: No acute disease. Electronically Signed   By: Inge Rise M.D.   On: 11/12/2017 15:43   Disposition   Pt is being discharged home today in good condition.  Follow-up Plans & Appointments    Follow-up Information    Lendon Colonel, NP Follow up on 12/09/2017.   Specialties:  Nurse Practitioner, Radiology, Cardiology Why:  at 3pm for your follow up appt.  Contact information: [address] [place] [address] [phone]          Discharge Instructions    AMB Referral to Cardiac Rehabilitation - Phase II   Complete by:  As directed    Diagnosis:  Coronary Stents   Call MD for:  redness, tenderness, or signs of infection (pain, swelling, redness, odor or green/yellow discharge around incision site)   Complete by:  As  directed    Diet - low sodium heart healthy   Complete by:  As directed    Discharge instructions   Complete by:  As directed    Radial Site Care Refer to this sheet in the next few weeks. These instructions provide you with information on caring for yourself after your procedure. Your caregiver may also give you more specific instructions. Your treatment has been planned according to current medical practices, but problems sometimes occur. Call your caregiver if you have any problems or questions after your procedure. HOME CARE INSTRUCTIONS You may shower the day after the procedure.Remove the bandage (dressing) and gently wash the site with plain soap and water.Gently pat the site dry.  Do not apply powder or lotion to the site.  Do not submerge the affected site in water for 3 to 5 days.  Inspect the site at least twice  daily.  Do not flex or bend the affected arm for 24 hours.  No lifting over 5 pounds (2.3 kg) for 5 days after your procedure.  Do not drive home if you are discharged the same day of the procedure. Have someone else drive you.  You may drive 24 hours after the procedure unless otherwise instructed by your caregiver.  What to expect: Any bruising will usually fade within 1 to 2 weeks.  Blood that collects in the tissue (hematoma) may be painful to the touch. It should usually decrease in size and tenderness within 1 to 2 weeks.  SEEK IMMEDIATE MEDICAL CARE IF: You have unusual pain at the radial site.  You have redness, warmth, swelling, or pain at the radial site.  You have drainage (other than a small amount of blood on the dressing).  You have chills.  You have a fever or persistent symptoms for more than 72 hours.  You have a fever and your symptoms suddenly get worse.  Your arm becomes pale, cool, tingly, or numb.  You have heavy bleeding from the site. Hold pressure on the site.   Increase activity slowly   Complete by:  As directed       Discharge Medications     Medication List    TAKE these medications   aspirin EC 81 MG tablet Take 81 mg by mouth every other day.   atorvastatin 80 MG tablet Commonly known as:  LIPITOR Take 1 tablet (80 mg total) by mouth daily at 6 PM.   metoprolol succinate 50 MG 24 hr tablet Commonly known as:  TOPROL-XL Take 1 tablet (50 mg total) by mouth daily.   nitroGLYCERIN 0.4 MG SL tablet Commonly known as:  NITROSTAT Place 1 tablet (0.4 mg total) under the tongue every 5 (five) minutes as needed.   OVER THE COUNTER MEDICATION Take 1 capsule by mouth daily. Super Beta Prostate   ticagrelor 90 MG Tabs tablet Commonly known as:  BRILINTA Take 1 tablet (90 mg total) by mouth 2 (two) times daily.       Acute coronary syndrome (MI, NSTEMI, STEMI, etc) this admission?: No.     Outstanding Labs/Studies   FLP/LFTs in 6 weeks if  tolerating statin.   Duration of Discharge Encounter   Greater than 30 minutes including physician time.  Signed, Reino Bellis NP-C 11/19/2017, 8:47 AM  Agree with note by Reino Bellis NP-C  Postop day #1 mid LAD PCI and drug-eluting stenting by Dr. Claiborne Billings.  He had no other significant disease his LV function was normal.  He is on aspirin and Brilinta.  His labs are normal.  His right radial puncture site appears stable.  He is okay for discharge home today.  He will follow-up with Dr. Debara Pickett as an outpatient.  Lorretta Harp, M.D., [place], [facility], [place] [address] [place], [place]  [address]  [phone] 11/19/2017 9:39 AM

## 2017-11-19 NOTE — Care Management Note (Signed)
Case Management Note  Patient Details  Name: Steven Larsen MRN: [id number] Date of Birth: May 07, 1950  Subjective/Objective:    From home with spouse, s/p stent intervention, will be on brilinta, NCM informed patient of co pay, if too much patient will let MD know at follow so he can be switched to something else, but wife said they will try to stay on the [name].  Pharmacist came by for meds to bed for patient per [facility] RN.                 Action/Plan: DC home when ready.   Expected Discharge Date:                  Expected Discharge Plan:  Home/Self Care  In-House Referral:     Discharge planning Services  CM Consult, Medication Assistance  Post Acute Care Choice:    Choice offered to:     DME Arranged:    DME Agency:     HH Arranged:    HH Agency:     Status of Service:  Completed, signed off  If discussed at [employer] of Stay Meetings, dates discussed:    Additional Comments:  Zenon Mayo, RN 11/19/2017, 10:09 AM

## 2017-11-23 ENCOUNTER — Telehealth (HOSPITAL_COMMUNITY): Payer: Self-pay

## 2017-11-23 NOTE — Telephone Encounter (Signed)
Pt insurance is active and benefits verified through Medicare A/B. Co-pay $0.00, DED $185.00.00/$185.00 met, out of pocket $0.00/$0.00 met, co-insurance 20%. No pre-authorization. Passport, 11/23/17 @ 11:18AM, MEB#[id number]  2ndary insurance is active and benefits verified through [employer]. Co-pay $0.00, DED $0.00.00/$0.00 met, out of pocket $0.00/$0.00 met, co-insurance 0%. No pre-authorization. Passport, 11/23/17 @ 12:31PM, SRP#[id number]

## 2017-11-24 ENCOUNTER — Telehealth (HOSPITAL_COMMUNITY): Payer: Self-pay

## 2017-11-24 NOTE — Telephone Encounter (Signed)
Called patient in regards to CR, pt stated he is not able to join the program at this time due to his work hours.  Closed referral

## 2017-11-25 ENCOUNTER — Encounter: Payer: Self-pay | Admitting: *Deleted

## 2017-12-09 ENCOUNTER — Encounter: Payer: Self-pay | Admitting: Adult Health

## 2017-12-09 ENCOUNTER — Ambulatory Visit (INDEPENDENT_AMBULATORY_CARE_PROVIDER_SITE_OTHER): Payer: Medicare Other | Admitting: Adult Health

## 2017-12-09 VITALS — BP 126/81 | HR 73 | Ht 70.0 in | Wt 222.0 lb

## 2017-12-09 DIAGNOSIS — Z79899 Other long term (current) drug therapy: Secondary | ICD-10-CM

## 2017-12-09 DIAGNOSIS — E7800 Pure hypercholesterolemia, unspecified: Secondary | ICD-10-CM

## 2017-12-09 DIAGNOSIS — I2510 Atherosclerotic heart disease of native coronary artery without angina pectoris: Secondary | ICD-10-CM

## 2017-12-09 DIAGNOSIS — I200 Unstable angina: Secondary | ICD-10-CM

## 2017-12-09 DIAGNOSIS — I10 Essential (primary) hypertension: Secondary | ICD-10-CM

## 2017-12-09 NOTE — Patient Instructions (Signed)
Medication Instructions:  NO CHANGES- Your physician recommends that you continue on your current medications as directed. Please refer to the Current Medication list given to you today.  If you need a refill on your cardiac medications before your next appointment, please call your pharmacy.  Labwork: BMET AND LIPID FASTING 1 WEEK BEFORE NEXT VISIT HERE IN OUR OFFICE AT [facility]  Take the provided lab slips with you to the lab for your blood draw.   You will need to fast. DO NOT EAT OR DRINK PAST MIDNIGHT.   Follow-Up: Your physician wants you to follow-up in: 3 MONTHS WITH DR HILTY   Thank you for choosing [facility] at [facility]!!

## 2017-12-09 NOTE — Progress Notes (Signed)
Cardiology Office Note   Date:  12/09/2017   ID:  Steven Larsen, DOB [birth year], MRN [id number]  PCP:  Patient, No Pcp Per  Cardiologist:  Dr. Debara Pickett  Chief Complaint  Patient presents with  . Follow-up  . Coronary Artery Disease    s/p cardiac cath with stent to LAD  . Hospitalization Follow-up     History of Present Illness: Steven Larsen is a 67 y.o. male who presents for post hospitalization after admission for unstable angina. He is also followed by GI with possible reflux. Cardiac cath was completed 11/18/2017 revealing single vessel disease with a proximal LAD to Mid LAD lesion which was 95% stenosed. He has successful DES resulting in 0% residual stenosis.   He is here today without complaints. He has lost 12 lbs since changing to a heart healthy diet. He is medically compliant and is having no complaints of breathing difficulties or bleeding. He has multiple questions about his cath results.    Past Medical History:  Diagnosis Date  . CAD (coronary artery disease)    11/18/17 PCI/DES mLAD, normal EF  . Diverticulosis   . High cholesterol   . History of kidney stones   . Hypertension   . Internal hemorrhoid     Past Surgical History:  Procedure Laterality Date  . LEFT HEART CATH AND CORONARY ANGIOGRAPHY N/A 11/18/2017   Procedure: LEFT HEART CATH AND CORONARY ANGIOGRAPHY;  Surgeon: Troy Sine, MD;  Location: [place] [facility];  Service: Cardiovascular;  Laterality: N/A;     Current Outpatient Medications  Medication Sig Dispense Refill  . aspirin EC 81 MG tablet Take 81 mg by mouth every other day.     Marland Kitchen atorvastatin (LIPITOR) 80 MG tablet Take 1 tablet (80 mg total) by mouth daily at 6 PM. 30 tablet 2  . metoprolol succinate (TOPROL-XL) 50 MG 24 hr tablet Take 1 tablet (50 mg total) by mouth daily. 30 tablet 2  . nitroGLYCERIN (NITROSTAT) 0.4 MG SL tablet Place 1 tablet (0.4 mg total) under the tongue every 5 (five) minutes as needed. 25 tablet 0  . OVER THE  COUNTER MEDICATION Take 1 capsule by mouth daily. Super Beta Prostate     . ticagrelor (BRILINTA) 90 MG TABS tablet Take 1 tablet (90 mg total) by mouth 2 (two) times daily. 60 tablet 2   No current facility-administered medications for this visit.     Allergies:   Patient has no known allergies.    Social History:  The patient  reports that he has never smoked. He has never used smokeless tobacco. He reports that he does not drink alcohol or use drugs.   Family History:  The patient's family history includes Prostate cancer in his father.    ROS: All other systems are reviewed and negative. Unless otherwise mentioned in H&P    PHYSICAL EXAM: VS:  BP 126/81   Pulse 73   Ht 5\' 10"  (1.778 m)   Wt 222 lb (100.7 kg)   BMI 31.85 kg/m  , BMI Body mass index is 31.85 kg/m. GEN: Well nourished, well developed, in no acute distress HEENT: normal Neck: no JVD, carotid bruits, or masses Cardiac: RRR; no murmurs, rubs, or gallops,no edema  Respiratory:  Clear to auscultation bilaterally, normal work of breathing GI: soft, nontender, nondistended, + BS MS: no deformity or atrophy Skin: warm and dry, no rash Neuro:  Strength and sensation are intact Psych: euthymic mood, full affect   EKG:  NSR  with frequent PVC's. Rate of 77 bpm.  Recent Labs: 11/12/2017: ALT 27 11/19/2017: BUN 17; Creatinine, Ser 0.93; Hemoglobin 14.3; Platelets 182; Potassium 3.7; Sodium 138    Lipid Panel    Component Value Date/Time   CHOL 222 (H) 11/12/2017 1133   TRIG 114 11/12/2017 1133   HDL 48 11/12/2017 1133   CHOLHDL 4.6 11/12/2017 1133   LDLCALC 151 (H) 11/12/2017 1133      Wt Readings from Last 3 Encounters:  12/09/17 222 lb (100.7 kg)  11/19/17 235 lb 14.3 oz (107 kg)  11/11/17 234 lb (106.1 kg)      Other studies Reviewed: Cath: 11/18/17   Prox LAD to Mid LAD lesion is 95% stenosed.  Ost LAD to Prox LAD lesion is 30% stenosed.  Prox LAD-1 lesion is 20% stenosed.  Prox LAD-2 lesion  is 30% stenosed.  A stent was successfully placed.  Post intervention, there is a 0% residual stenosis.  Post intervention, there is a 0% residual stenosis.  The left ventricular systolic function is normal.  LV end diastolic pressure is normal.  Single-vessel coronary obstructive disease in a dominant left coronary system. There is 30% smooth proximal LAD stenosis before the takeoff of the first diagonal vessel. There is a 20% stenosis immediately after the first small diagonal vessel. In the mid LAD there was 30% proximal and 95% eccentric stenosis immediately after the second diagonal vessel. The LAD was large caliber wrapped around the apex.  The ramus intermediate, dominant left circumflex vessel, and nondominant RCA vessels were normal.  Global LV function with an ejection fraction at 55%. LVEDP 13 mm. There were no focal segmental wall motion abnormalities.  RECOMMENDATION: The patient will be started on dual antiplatelet therapy with aspirin and Brilinta. High potency statin therapy will be initiated. The patient will also be started on beta-blocker therapy. Optimal blood pressure with target less than 130/80 will be necessary.  ASSESSMENT AND PLAN:  1.  CAD: S/P cardiac cath requiring DES to LAD. I have printed out a copy of the cardiac cath report and illustration explaining his results and pointing out where his stent is located. I have reviewed all of his medications with explanations. They verbalized understanding. Due to ectopy I have advised him to increase his potassium in his diet. He is asymptomatic currently.   2. Obesity: He has lost 12 lbs with heart healthy diet. I have congratulated him on this lifestyle change.Encourage him to continue.   3 Hypercholesterolemia; He is to continue statin therapy. Will recheck BMET and lipids prior to next appointment.    Current medicines are reviewed at length with the patient today.    Labs/ tests ordered today  include: BMET Lipids and LFT's  Phill Myron. [place] Pugh, ANP, AACC   12/09/2017 [place] [address] Office [phone] Fax [phone]

## 2017-12-16 ENCOUNTER — Telehealth: Payer: Self-pay | Admitting: Internal Medicine

## 2017-12-16 NOTE — Telephone Encounter (Signed)
Per pt's wife call needs a note to excuses the patient form jury duty per his Card and wife also stated pt's BP has been running high, stressed pt can not do it.  Please give her a call with any questions.

## 2017-12-16 NOTE — Telephone Encounter (Signed)
I cannot give him a note to be excused due to stress. He will have to talk to PCP for note.

## 2017-12-16 NOTE — Telephone Encounter (Signed)
Spoke with pt wife. She is requesting a excuse letter for th pt to be excused from jury duty. She sts that the pt has been under a lot of stress, and doesn't not think he should attend at this time.  Asked for an update on the pt BP readings. She sts that it is usually elevated when he gets home from work, but she did not provide any readings.  Adv her that I will fwd the rqst to Jory Sims D-NP and we will call back with her response.  Mrs. Salehi verbalized understanding and voiced appreciation for the call back.

## 2017-12-16 NOTE — Telephone Encounter (Signed)
Called pt wife to give Kathryn's response. [email or web address]

## 2017-12-16 NOTE — Telephone Encounter (Signed)
LEFT DETAILED MESSAGE.  

## 2017-12-25 NOTE — Telephone Encounter (Signed)
New Message   Pt c/o medication issue:  1. Name of Medication: ticagrelor (BRILINTA) 90 MG TABS tablet, atorvastatin (LIPITOR) 80 MG tablet and metoprolol succinate (TOPROL-XL) 50 MG 24 hr tablet  2. How are you currently taking this medication (dosage and times per day)?   3. Are you having a reaction (difficulty breathing--STAT)? Not sure  4. What is your medication issue? Pt's wife is calling states that the pt is having leg pain, blurred vision and they think it might be coming from one of the medications listed. Please call

## 2017-12-25 NOTE — Telephone Encounter (Signed)
Spoke with pt wife [name]. Pt has been having some leg pain and transient blurred vision that they feel is related to his medication. Pt denies chest pain, palpitations, sob. She thinks pt needs to be seen to discuss possible med adjustments. No vital signs provided. She sts that the pt has not been monitoring his BP. appt scheduled with Jory Sims D-NP on 10/15 @ 3:30pm. Adv her to have the pt bring an update list of his medications with him to his appt.

## 2017-12-27 NOTE — Telephone Encounter (Signed)
Thank you :)

## 2017-12-28 NOTE — Progress Notes (Signed)
Cardiology Office Note   Date:  12/29/2017   ID:  Steven Larsen, DOB 07-29-1950, MRN [id number]  PCP:  Patient, No Pcp Per  Cardiologist:  Dr. Debara Pickett Chief Complaint  Patient presents with  . Leg Pain  . Blurred Vision     History of Present Illness: Steven Larsen is a 67 y.o. male who presents for ongoing assessment of unstable angina. He was recently hospitalized in 11/18/2017 with chest pain. He had a cardiac cath revealing single vessel disease with a proximal LAD to Mid LAD lesion which was 95% stenosed. He has successful DES resulting in 0% residual stenosis.   He has some concerns about blurred vision and leg pain and thought it was related to his medications. He has not monitored his BP.   He is here today at the insistence of his wife who accompanies him. His family has noticed that he has complained of blurred vision after taking Brilinta, usually wears off a few hours before the next dose. His wife states that his sclera has become "jaundiced." Which apparently comes and goes. He has also complained of right medial lateral knee pain after twisting it slightly while a work. The patient tends to diminish his symptoms, and his wife is worried that because he does not complain others have to speak for him.  He denies chest pain, DOE, fatigue, bleeding or dizziness. He has adhered to a heart healthy diet and has lost 16 lbs. He has eliminated cola's, and high cholesterol foods.   Past Medical History:  Diagnosis Date  . CAD (coronary artery disease)    11/18/17 PCI/DES mLAD, normal EF  . Diverticulosis   . High cholesterol   . History of kidney stones   . Hypertension   . Internal hemorrhoid     Past Surgical History:  Procedure Laterality Date  . LEFT HEART CATH AND CORONARY ANGIOGRAPHY N/A 11/18/2017   Procedure: LEFT HEART CATH AND CORONARY ANGIOGRAPHY;  Surgeon: Troy Sine, MD;  Location: [place] [facility];  Service: Cardiovascular;  Laterality: N/A;     Current  Outpatient Medications  Medication Sig Dispense Refill  . acetaminophen (TYLENOL) 500 MG tablet Take 500 mg by mouth as needed.    Marland Kitchen aspirin EC 81 MG tablet Take 81 mg by mouth every other day.     Marland Kitchen atorvastatin (LIPITOR) 80 MG tablet Take 1 tablet (80 mg total) by mouth daily at 6 PM. 30 tablet 2  . LYSINE PO Take by mouth.    . metoprolol succinate (TOPROL-XL) 50 MG 24 hr tablet Take 1 tablet (50 mg total) by mouth daily. 30 tablet 2  . nitroGLYCERIN (NITROSTAT) 0.4 MG SL tablet Place 1 tablet (0.4 mg total) under the tongue every 5 (five) minutes as needed. 25 tablet 0  . OVER THE COUNTER MEDICATION Take 1 capsule by mouth daily. Super Beta Prostate     . OVER THE COUNTER MEDICATION Take 2 capsules by mouth daily.    Marland Kitchen OVER THE COUNTER MEDICATION Take 2 capsules by mouth daily.    . ticagrelor (BRILINTA) 90 MG TABS tablet Take 1 tablet (90 mg total) by mouth 2 (two) times daily. 60 tablet 2   No current facility-administered medications for this visit.     Allergies:   Patient has no known allergies.    Social History:  The patient  reports that he has never smoked. He has never used smokeless tobacco. He reports that he does not drink alcohol or use  drugs.   Family History:  The patient's family history includes Prostate cancer in his father.    ROS: All other systems are reviewed and negative. Unless otherwise mentioned in H&P    PHYSICAL EXAM: VS:  BP (!) 142/82   Pulse 65   Ht 5\' 10"  (1.778 m)   Wt 219 lb (99.3 kg)   BMI 31.42 kg/m  , BMI Body mass index is 31.42 kg/m. GEN: Well nourished, well developed, in no acute distress HEENT: normal-sclera is clear.  Neck: no JVD, carotid bruits, or masses Cardiac: RRR; no murmurs, rubs, or gallops,no edema  Respiratory:  Clear to auscultation bilaterally, normal work of breathing GI: soft, nontender, nondistended, + BS MS: no deformity or atrophy Skin: warm and dry, no rash Neuro:  Strength and sensation are intact Psych:  euthymic mood, full affect   EKG:Not completed this office visit.   Recent Labs: 11/12/2017: ALT 27 11/19/2017: BUN 17; Creatinine, Ser 0.93; Hemoglobin 14.3; Platelets 182; Potassium 3.7; Sodium 138    Lipid Panel    Component Value Date/Time   CHOL 222 (H) 11/12/2017 1133   TRIG 114 11/12/2017 1133   HDL 48 11/12/2017 1133   CHOLHDL 4.6 11/12/2017 1133   LDLCALC 151 (H) 11/12/2017 1133      Wt Readings from Last 3 Encounters:  12/29/17 219 lb (99.3 kg)  12/09/17 222 lb (100.7 kg)  11/19/17 235 lb 14.3 oz (107 kg)     Other studies Reviewed: Cardiac Cath 11/18/2017   Prox LAD to Mid LAD lesion is 95% stenosed.  Ost LAD to Prox LAD lesion is 30% stenosed.  Prox LAD-1 lesion is 20% stenosed.  Prox LAD-2 lesion is 30% stenosed.  A stent was successfully placed.  Post intervention, there is a 0% residual stenosis.  Post intervention, there is a 0% residual stenosis.  The left ventricular systolic function is normal.  LV end diastolic pressure is normal.   Single-vessel coronary obstructive disease in a dominant left coronary system.  There is 30% smooth proximal LAD stenosis before the takeoff of the first diagonal vessel.  There is a 20% stenosis immediately after the first small diagonal vessel.  In the mid LAD there was 30% proximal and 95% eccentric stenosis immediately after the second diagonal vessel.  The LAD was large caliber wrapped around the apex.  The ramus intermediate, dominant left circumflex vessel, and nondominant RCA vessels were normal.  Global LV function with an ejection fraction at 55%.  LVEDP 13 mm.  There were no focal segmental wall motion abnormalities.  RECOMMENDATION: The patient will be started on dual antiplatelet therapy with aspirin and Brilinta.  High potency statin therapy will be initiated.  The patient will also be started on beta-blocker therapy.  Optimal blood pressure with target less than 130/80 will be necessary.  ASSESSMENT  AND PLAN:  1.  Blurred vision: Can be potentially being caused by Brilinta. I have offered to change antiplatelet to Plavix but he refuses. He does not think the vision disturbance is very severe. If it gets worse or he changes his mind he will inform us. He will need reloading with Plavix 300 mg then begin 75 mg daily. Right now he requests that we hold off on changes. I will check a CBC and BMET today.   2. CAD: Stent to the proximal LAD. He is continued on BB, DAPT. Not currently on ACE.   3. Hypercholesterolemia: Continue on atorvastatin. Will check lipids and LFTs today.   4. Hypertension:  BP was elevated initially. I rechecked it in the exam room. Down to 132/78. No changes in regimen at this time.   5 Obesity; He has lost 16 lbs with diet and exercise. He was told he was "pre-diabetic" by PCP. Will have Hgb A1C drawn along with labs.   6. Leg Pain:Appears to be musculoskeletal strain. He states it is much better now. Doubt myalgia pain as it was localized.   Current medicines are reviewed at length with the patient today.    Labs/ tests ordered today include: Hgb A1c, BMET, Lipids, LFT's. CBC.  Phill Myron. [place] Pugh, ANP, AACC   12/29/2017 4:47 PM    [address] Office [phone] Fax [phone]

## 2017-12-29 ENCOUNTER — Encounter: Payer: Self-pay | Admitting: Adult Health

## 2017-12-29 ENCOUNTER — Ambulatory Visit (INDEPENDENT_AMBULATORY_CARE_PROVIDER_SITE_OTHER): Payer: Medicare Other | Admitting: Adult Health

## 2017-12-29 VITALS — BP 142/82 | HR 65 | Ht 70.0 in | Wt 219.0 lb

## 2017-12-29 DIAGNOSIS — E7800 Pure hypercholesterolemia, unspecified: Secondary | ICD-10-CM

## 2017-12-29 DIAGNOSIS — I10 Essential (primary) hypertension: Secondary | ICD-10-CM | POA: Diagnosis not present

## 2017-12-29 DIAGNOSIS — I2510 Atherosclerotic heart disease of native coronary artery without angina pectoris: Secondary | ICD-10-CM | POA: Diagnosis not present

## 2017-12-29 DIAGNOSIS — I200 Unstable angina: Secondary | ICD-10-CM

## 2017-12-29 DIAGNOSIS — Z79899 Other long term (current) drug therapy: Secondary | ICD-10-CM | POA: Diagnosis not present

## 2017-12-29 NOTE — Patient Instructions (Signed)
Follow-Up: You will need a follow up appointment in [place].   Medication Instructions:  NO CHANGES- Your physician recommends that you continue on your current medications as directed. Please refer to the Current Medication list given to you today.  If you need a refill on your cardiac medications before your next appointment, please call your pharmacy.  Labwork: A1C,BMET,CBC,FLP AND LFT TODAY HERE IN OUR LAB  ->If you have labs (blood work) drawn today and your tests are completely normal, you will receive your results ONLY by: . MyChart Message (if you have MyChart) -OR- . A paper copy in the mail  At [facility], you and your health needs are our priority.  As part of our continuing mission to provide you with exceptional heart care, we have created designated Provider Care Teams.  These Care Teams include your primary Cardiologist (physician) and Advanced Practice Providers (APPs -  Physician Assistants and Nurse Practitioners) who all work together to provide you with the care you need, when you need it.  Thank you for choosing [facility] at [facility]!!

## 2017-12-30 LAB — CBC
Hematocrit: 44.3 % (ref 37.5–51.0)
Hemoglobin: 15.1 g/dL (ref 13.0–17.7)
MCH: 31.3 pg (ref 26.6–33.0)
MCHC: 34.1 g/dL (ref 31.5–35.7)
MCV: 92 fL (ref 79–97)
PLATELETS: 243 10*3/uL (ref 150–450)
RBC: 4.82 x10E6/uL (ref 4.14–5.80)
RDW: 12 % — AB (ref 12.3–15.4)
WBC: 8.4 10*3/uL (ref 3.4–10.8)

## 2017-12-30 LAB — TSH: TSH: 2.86 u[IU]/mL (ref 0.450–4.500)

## 2017-12-30 LAB — BASIC METABOLIC PANEL
BUN / CREAT RATIO: 20 (ref 10–24)
BUN: 19 mg/dL (ref 8–27)
CALCIUM: 9.7 mg/dL (ref 8.6–10.2)
CHLORIDE: 104 mmol/L (ref 96–106)
CO2: 23 mmol/L (ref 20–29)
Creatinine, Ser: 0.96 mg/dL (ref 0.76–1.27)
GFR, EST AFRICAN AMERICAN: 95 mL/min/{1.73_m2} (ref >59)
GFR, EST NON AFRICAN AMERICAN: 83 mL/min/{1.73_m2} (ref >59)
Glucose: 104 mg/dL — ABNORMAL HIGH (ref 65–99)
POTASSIUM: 4.7 mmol/L (ref 3.5–5.2)
Sodium: 144 mmol/L (ref 134–144)

## 2017-12-30 LAB — HEPATIC FUNCTION PANEL
ALT: 28 IU/L (ref 0–44)
AST: 22 IU/L (ref 0–40)
Albumin: 4.7 g/dL (ref 3.6–4.8)
Alkaline Phosphatase: 95 IU/L (ref 39–117)
Bilirubin Total: 1.1 mg/dL (ref 0.0–1.2)
Bilirubin, Direct: 0.29 mg/dL (ref 0.00–0.40)
TOTAL PROTEIN: 7.2 g/dL (ref 6.0–8.5)

## 2017-12-30 LAB — LIPID PANEL
CHOL/HDL RATIO: 2.8 ratio (ref 0.0–5.0)
Cholesterol, Total: 128 mg/dL (ref 100–199)
HDL: 46 mg/dL (ref >39)
LDL CALC: 55 mg/dL (ref 0–99)
Triglycerides: 135 mg/dL (ref 0–149)
VLDL CHOLESTEROL CAL: 27 mg/dL (ref 5–40)

## 2017-12-30 LAB — HEMOGLOBIN A1C
Est. average glucose Bld gHb Est-mCnc: 126 mg/dL
HEMOGLOBIN A1C: 6 % — AB (ref 4.8–5.6)

## 2018-01-04 ENCOUNTER — Other Ambulatory Visit: Payer: Self-pay | Admitting: Internal Medicine

## 2018-01-04 DIAGNOSIS — E7800 Pure hypercholesterolemia, unspecified: Secondary | ICD-10-CM

## 2018-01-04 DIAGNOSIS — Z79899 Other long term (current) drug therapy: Secondary | ICD-10-CM

## 2018-01-04 MED ORDER — TICAGRELOR 90 MG PO TABS: 90.0000 mg | ORAL_TABLET | Freq: Two times a day (BID) | ORAL | 0 refills | Status: DC

## 2018-01-04 MED ORDER — TICAGRELOR 90 MG PO TABS: 90.0000 mg | ORAL_TABLET | Freq: Two times a day (BID) | ORAL | 11 refills | Status: DC

## 2018-01-04 NOTE — Telephone Encounter (Signed)
Attempted to return call to wife with MD suggested. Her VM has not been set up - no answer

## 2018-01-04 NOTE — Telephone Encounter (Signed)
Patient calling the office for samples of medication:   1.  What medication and dosage are you requesting samples for? Brilinta  2.  Are you currently out of this medication?  2 left

## 2018-01-04 NOTE — Progress Notes (Signed)
Notes recorded by Lendon Colonel, NP on 12/30/2017 at 8:31 AM EDT Reviewed labs. Cholesterol is excellent! Good response to statin therapy and his heart healthy diet. Great job. Other labs are normal, including his liver enzymes. No evidence of anemia. Hgb A1c is 6.0. Slightly high normal.With continued weight loss and healthy eating this will likely improve as this is a 3 month trend. Can repeat after the first of the year to see how it has improved. If blurred vision continues to be a problem on Brilinta let us know so we can try changing to Plavix  Future orders entered

## 2018-01-04 NOTE — Telephone Encounter (Signed)
SPOKE TO WIFE . AWARE SAMPLES ARE AVAILABLE. ONLY ENOUGH FOR A WEEK   WIFE STATES THE MEDICATION- BRILINTA WILL COST PATIENT $400 TO $300 A MONTH . WIFE STATES THEY CAN NOT AFFORD THAT AMOUNT.  PATIENT HAS PART D- MEDICARE PER WIFE.   RN INSTRUCTED WIFE TO CONNECT TO SEE IF PART -D WILL COVER MEDICATION  AT  LOWER COST. ALSO LEFT A SAVING CARD FOR A MONTH FREE.   INSTRUCTED WIFE TO CONTACT OFFICE IF NEED TO COME UP WITH ANOTHER OPTION WIFE  VERBALIZED UNDERSTANDING

## 2018-01-04 NOTE — Telephone Encounter (Signed)
Could switch to Plavix - he would have to take a 300 mg loading dose (4 tablets) then start 75 mg daily thereafter. Continue aspirin 81 mg daily.  Dr. Lemmie Evens

## 2018-01-07 NOTE — Telephone Encounter (Signed)
Spoke with patient's wife. She states she has been able to pick up samples & Rx from pharmacy. She was able to get in contact with insurance and is hoping to get the cost of the medication straightened out. They have a free 30 day card and have used this. Patient prefers NOT to change medications. Notified wife that there are other medication options available if he does not need to make a change in the future.

## 2018-02-15 ENCOUNTER — Other Ambulatory Visit: Payer: Self-pay | Admitting: Internal Medicine

## 2018-02-15 NOTE — Telephone Encounter (Signed)
Patient calling the office for samples of medication:   1.  What medication and dosage are you requesting samples for? Brilinta  2.  Are you currently out of this medication? 3 pills left

## 2018-02-15 NOTE — Telephone Encounter (Signed)
Informed pt that samples are available at front desk for pick-up; pt verbalized understanding and stated that spouse would pick up  Drug name: Brilinta       Strength: 90 mg        Qty: 2 bottles  LOT: [id number]  Exp.Date: 07/2020     Sabreen Kitchen O Jazaria Jarecki 1:20 PM 02/15/2018

## 2018-02-20 ENCOUNTER — Other Ambulatory Visit: Payer: Self-pay | Admitting: Internal Medicine

## 2018-02-22 NOTE — Telephone Encounter (Signed)
Rx has been sent to the pharmacy electronically. ° °

## 2018-03-02 ENCOUNTER — Encounter: Payer: Self-pay | Admitting: Internal Medicine

## 2018-03-02 ENCOUNTER — Ambulatory Visit (INDEPENDENT_AMBULATORY_CARE_PROVIDER_SITE_OTHER): Payer: Medicare Other | Admitting: Internal Medicine

## 2018-03-02 VITALS — BP 140/72 | HR 61 | Ht 70.0 in | Wt 231.4 lb

## 2018-03-02 DIAGNOSIS — I200 Unstable angina: Secondary | ICD-10-CM | POA: Diagnosis not present

## 2018-03-02 DIAGNOSIS — E7800 Pure hypercholesterolemia, unspecified: Secondary | ICD-10-CM

## 2018-03-02 DIAGNOSIS — I10 Essential (primary) hypertension: Secondary | ICD-10-CM

## 2018-03-02 DIAGNOSIS — I2510 Atherosclerotic heart disease of native coronary artery without angina pectoris: Secondary | ICD-10-CM

## 2018-03-02 NOTE — Progress Notes (Signed)
OFFICE CONSULT NOTE  Chief Complaint:  Follow-up cath  Primary Care Physician: Patient, No Pcp Per  HPI:  Steven Larsen is a 67 y.o. male who is being seen today for the evaluation of exertional dyspnea, chest discomfort at the request of Dr. Carlean Purl. 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  Recently he was push mowing the yard and noted onset of chest tightness and burning which resolved at rest.  The symptoms then came back when he started working again.  This happened several times and then eventually stopped.  There was originally concern for possible reflux and he was seen by Dr. Arelia Longest with [facility].  Because of the exertional nature of his symptoms, he was concerned that this may represent unstable angina.  Steven Larsen, has not had any routine medical care over the past several years.  He does not currently take medications or carry any outstanding diagnoses.  Family history is not significant for early onset heart disease, mostly notable for cancer in his father.  He reports taking some reflux medications without any benefit.  Blood pressure today noted to be elevated in the upper 140s over 90s.  He is not known to have hypertension.  03/02/2018  Steven Larsen returns today for follow-up of heart cath.  When I saw him in the office he was complaining of symptoms concerning for unstable angina and he was referred directly for heart catheterization.  This was performed on 11/18/2017, and demonstrated single-vessel coronary disease with a 95% eccentric mid LAD stenosis after a second diagonal vessel.  LVEF was normal at 55%.  He underwent PCI to the LAD with excellent result.  Ultimately was switched to aspirin and Brilinta.  Since then on follow-up he has been doing well.  His blood sugar was noted to be elevated and his cholesterol has  significantly improved to LDL of 55 on high potency statin.  He denies any shortness of breath or recurrent chest pain.  He is managed to lose about 20 pounds with significant dietary changes.  PMHx:  Past Medical History:  Diagnosis Date  . CAD (coronary artery disease)    11/18/17 PCI/DES mLAD, normal EF  . Diverticulosis   . High cholesterol   . History of kidney stones   . Hypertension   . Internal hemorrhoid     Past Surgical History:  Procedure Laterality Date  . LEFT HEART CATH AND CORONARY ANGIOGRAPHY N/A 11/18/2017   Procedure: LEFT HEART CATH AND CORONARY ANGIOGRAPHY;  Surgeon: Troy Sine, MD;  Location: [place] [facility];  Service: Cardiovascular;  Laterality: N/A;    FAMHx:  Family History  Problem Relation Age of Onset  . Prostate cancer Father     SOCHx:   reports that he has never smoked. He has never used smokeless tobacco. He reports that he does not drink alcohol or use drugs.  ALLERGIES:  No Known Allergies  ROS: Pertinent items noted in HPI and remainder of comprehensive ROS otherwise negative.  HOME MEDS: Current Outpatient Medications on File Prior to Visit  Medication Sig Dispense Refill  . acetaminophen (TYLENOL) 500 MG tablet Take 500 mg by mouth as needed.    Marland Kitchen aspirin EC 81 MG tablet Take 81 mg by mouth every other day.     Marland Kitchen atorvastatin (LIPITOR) 80 MG tablet TAKE 1  TABLET BY MOUTH EVERY DAY AT 6PM 90 tablet 2  . LYSINE PO Take by mouth.    . metoprolol succinate (TOPROL-XL) 50 MG 24 hr tablet TAKE 1 TABLET BY MOUTH EVERY DAY 90 tablet 2  . nitroGLYCERIN (NITROSTAT) 0.4 MG SL tablet Place 1 tablet (0.4 mg total) under the tongue every 5 (five) minutes as needed. 25 tablet 0  . OVER THE COUNTER MEDICATION Take 1 capsule by mouth daily. Super Beta Prostate     . OVER THE COUNTER MEDICATION Take 2 capsules by mouth daily.    Marland Kitchen OVER THE COUNTER MEDICATION Take 2 capsules by mouth daily.    . ticagrelor (BRILINTA) 90 MG TABS tablet Take 1  tablet (90 mg total) by mouth 2 (two) times daily. 24 tablet 0   No current facility-administered medications on file prior to visit.     LABS/IMAGING: No results found for this or any previous visit (from the past 48 hour(s)). No results found.  LIPID PANEL:    Component Value Date/Time   CHOL 128 12/29/2017 1607   TRIG 135 12/29/2017 1607   HDL 46 12/29/2017 1607   CHOLHDL 2.8 12/29/2017 1607   LDLCALC 55 12/29/2017 1607    WEIGHTS: Wt Readings from Last 3 Encounters:  03/02/18 231 lb 6.4 oz (105 kg)  12/29/17 219 lb (99.3 kg)  12/09/17 222 lb (100.7 kg)    VITALS: BP 140/72   Pulse 61   Ht 5\' 10"  (1.778 m)   Wt 231 lb 6.4 oz (105 kg)   BMI 33.20 kg/m   EXAM: General appearance: alert and no distress Neck: no carotid bruit, no JVD and thyroid not enlarged, symmetric, no tenderness/mass/nodules Lungs: clear to auscultation bilaterally Heart: regular rate and rhythm, S1, S2 normal, no murmur, click, rub or gallop Abdomen: soft, non-tender; bowel sounds normal; no masses,  no organomegaly and obese Extremities: extremities normal, atraumatic, no cyanosis or edema Pulses: 2+ and symmetric Skin: Skin color, texture, turgor normal. No rashes or lesions Neurologic: Grossly normal Psych: Pleasant  EKG: Normal sinus rhythm and 61-personally reviewed  ASSESSMENT: 1. Coronary artery disease status post PCI to the mid LAD (11/2017) 2. Dyslipidemia 3. Hypertension  PLAN: 1.   Steven Larsen has done well status post left heart cath in September with stent to the LAD.  He denies any further chest pain or worsening shortness of breath.  I commended him on his weight loss and dietary changes.  He has had marked improvement in his lipid profile on high potency statin.  I suspect his hemoglobin A1c will come down further.  We will plan to continue his current medications and see him back in September of next year, at which time we may be able to discontinue Brilinta.  Pixie Casino, MD, [facility], [place] Director of the Advanced Lipid Disorders &  Cardiovascular Risk Reduction Clinic Diplomate of the American Board of Clinical Lipidology Attending Cardiologist  Direct Dial: [phone]  Fax: [phone]  Website:  [email or web address] 03/02/2018, 4:59 PM

## 2018-03-02 NOTE — Patient Instructions (Signed)
Medication Instructions:  Continue current medications If you need a refill on your cardiac medications before your next appointment, please call your pharmacy.   Follow-Up: At [facility], you and your health needs are our priority.  As part of our continuing mission to provide you with exceptional heart care, we have created designated Provider Care Teams.  These Care Teams include your primary Cardiologist (physician) and Advanced Practice Providers (APPs -  Physician Assistants and Nurse Practitioners) who all work together to provide you with the care you need, when you need it. You will need a follow up appointment in 9 months.  Please call our office 2 months in advance to schedule this appointment.  You may see Dr. Debara Pickett or one of the following Advanced Practice Providers on your designated Care Team: Almyra Deforest, [place] . Fabian Sharp, PA-C  Any Other Special Instructions Will Be Listed Below (If Applicable).

## 2018-03-25 ENCOUNTER — Telehealth: Payer: Self-pay | Admitting: Internal Medicine

## 2018-03-25 NOTE — Telephone Encounter (Signed)
Brilinta 90 mg #4 lot [id number] exp 7/22  Advised Steven Larsen

## 2018-03-25 NOTE — Telephone Encounter (Signed)
New Message   Patient calling the office for samples of medication:   1.  What medication and dosage are you requesting samples for? ticagrelor (BRILINTA) 90 MG TABS tablet  2.  Are you currently out of this medication? 3 days remaining

## 2018-05-31 ENCOUNTER — Other Ambulatory Visit: Payer: Self-pay | Admitting: Internal Medicine

## 2018-05-31 MED ORDER — TICAGRELOR 90 MG PO TABS: 90.0000 mg | ORAL_TABLET | Freq: Two times a day (BID) | ORAL | 0 refills | Status: DC

## 2018-05-31 NOTE — Telephone Encounter (Signed)
Patient calling the office for samples of medication:   1.  What medication and dosage are you requesting samples for? Brilinta 90 mg  2.  Are you currently out of this medication? Yes

## 2018-05-31 NOTE — Telephone Encounter (Signed)
Spoke with patients spouse, Eryck Negron, per [facility] and informd her that samples were placed upfront. She voiced understanding.

## 2018-06-23 ENCOUNTER — Other Ambulatory Visit: Payer: Self-pay

## 2018-06-23 MED ORDER — TICAGRELOR 90 MG PO TABS: 90.0000 mg | ORAL_TABLET | Freq: Two times a day (BID) | ORAL | 3 refills | Status: DC

## 2018-07-21 DIAGNOSIS — C44311 Basal cell carcinoma of skin of nose: Secondary | ICD-10-CM | POA: Diagnosis not present

## 2018-08-18 DIAGNOSIS — Z85828 Personal history of other malignant neoplasm of skin: Secondary | ICD-10-CM | POA: Diagnosis not present

## 2018-08-18 DIAGNOSIS — Z08 Encounter for follow-up examination after completed treatment for malignant neoplasm: Secondary | ICD-10-CM | POA: Diagnosis not present

## 2018-10-11 ENCOUNTER — Telehealth: Payer: Self-pay | Admitting: Internal Medicine

## 2018-10-11 ENCOUNTER — Telehealth: Payer: Self-pay

## 2018-10-11 ENCOUNTER — Ambulatory Visit: Payer: Self-pay | Admitting: *Deleted

## 2018-10-11 DIAGNOSIS — R509 Fever, unspecified: Secondary | ICD-10-CM

## 2018-10-11 NOTE — Telephone Encounter (Signed)
Ad has a temp 101.9 F and no other sxs No known sick contacts either Concern about Covid ED said observe and get tested He doesn;'t have an active PCP  Given situation and his wife's illnesses and that he is my patient will have him get Covid-19 test.

## 2018-10-11 NOTE — Telephone Encounter (Signed)
Patient's wife [name] notified that he can go to the drive up testing at [facility] between 1-8:33

## 2018-10-11 NOTE — Telephone Encounter (Signed)
-----   Message from Gatha Mayer, MD sent at 10/11/2018  3:47 PM EDT ----- Regarding: covid test This is PJs brother and Marys husband.  He has a fever and no other sxs.  Steven Larsen is scared to death about Covid-19.   Steven Larsen doesn't have a PCP.  Please order a Covid-19 test at [facility] drive through.   Thanks  CEG

## 2018-10-11 NOTE — Telephone Encounter (Signed)
Pt's wife calling, on DPR, pt present but reluctant to speak to nurse.  Spoke to pt; reports fever of 100.7 this afternoon, after tylenol. Temp max 101.7 this am.ONset of temp Saturday.  Denies any other symptoms; no body aches, no GI issues, no pain, no dysuria. Denies SOB, headache. States "Mowed high grass Saturday  And could be from that." Denies sinus tenderness, no congestion. States checked for bites, ticks, none present. Denies headache. States no loss of sense of taste or smell. Has not traveled, no known exposure. States is staying hydrated. Advised Covid 19 testing. Pt states will wait a few days to see what temp does.  Wife states she will encourage testing. Informed of available sites.  Advised to CB or go to UC/ED if symptoms arise, temp does not resolve within 24 hrs. Pt does not have PCP.  Reason for Disposition . [1] Fever > 101 F (38.3 C) AND [2] age > 34  Answer Assessment - Initial Assessment Questions 1. TEMPERATURE: "What is the most recent temperature?"  "How was it measured?"      100.7 digitally 2. ONSET: "When did the fever start?"      Saturday 3. SYMPTOMS: "Do you have any other symptoms besides the fever?"  (e.g., colds, headache, sore throat, earache, cough, rash, diarrhea, vomiting, abdominal pain)     no 4. CAUSE: If there are no symptoms, ask: "What do you think is causing the fever?"      Mowed grass 5. ACTS: "Does anyone else in the family have an infection?"     no 6. TREATMENT: "What have you done so far to treat this fever?" (e.g., medications)     tylenol 7. IMMUNOCOMPROMISE: "Do you have of the following: diabetes, HIV positive, splenectomy, cancer chemotherapy, chronic steroid treatment, transplant patient, etc."     no  9. TRAVEL: "Have you traveled out of the country in the last month?" (e.g., travel history, exposures)     no  Protocols used: FEVER-A-AH

## 2018-10-12 ENCOUNTER — Encounter (HOSPITAL_COMMUNITY): Payer: Self-pay | Admitting: Emergency Medicine

## 2018-10-12 ENCOUNTER — Ambulatory Visit: Payer: Self-pay | Admitting: General Practice

## 2018-10-12 ENCOUNTER — Emergency Department (HOSPITAL_COMMUNITY)
Admission: EM | Admit: 2018-10-12 | Discharge: 2018-10-12 | Disposition: A | Discharge status: Home / Self Care | Payer: Medicare Other | Attending: Emergency Medicine | Admitting: Emergency Medicine

## 2018-10-12 ENCOUNTER — Emergency Department (HOSPITAL_COMMUNITY): Payer: Medicare Other

## 2018-10-12 ENCOUNTER — Other Ambulatory Visit: Payer: Self-pay

## 2018-10-12 DIAGNOSIS — Z79899 Other long term (current) drug therapy: Secondary | ICD-10-CM | POA: Insufficient documentation

## 2018-10-12 DIAGNOSIS — J189 Pneumonia, unspecified organism: Secondary | ICD-10-CM | POA: Diagnosis not present

## 2018-10-12 DIAGNOSIS — R509 Fever, unspecified: Secondary | ICD-10-CM | POA: Diagnosis present

## 2018-10-12 DIAGNOSIS — Z20822 Contact with and (suspected) exposure to covid-19: Secondary | ICD-10-CM

## 2018-10-12 DIAGNOSIS — J188 Other pneumonia, unspecified organism: Secondary | ICD-10-CM | POA: Diagnosis not present

## 2018-10-12 DIAGNOSIS — Z20828 Contact with and (suspected) exposure to other viral communicable diseases: Secondary | ICD-10-CM | POA: Diagnosis not present

## 2018-10-12 DIAGNOSIS — I10 Essential (primary) hypertension: Secondary | ICD-10-CM | POA: Diagnosis not present

## 2018-10-12 DIAGNOSIS — R6889 Other general symptoms and signs: Secondary | ICD-10-CM | POA: Diagnosis not present

## 2018-10-12 DIAGNOSIS — I2510 Atherosclerotic heart disease of native coronary artery without angina pectoris: Secondary | ICD-10-CM | POA: Diagnosis not present

## 2018-10-12 DIAGNOSIS — R918 Other nonspecific abnormal finding of lung field: Secondary | ICD-10-CM | POA: Diagnosis not present

## 2018-10-12 LAB — CBC WITH DIFFERENTIAL/PLATELET
Abs Immature Granulocytes: 0.31 10*3/uL — ABNORMAL HIGH (ref 0.00–0.07)
Basophils Absolute: 0.1 10*3/uL (ref 0.0–0.1)
Basophils Relative: 0 %
Eosinophils Absolute: 0 10*3/uL (ref 0.0–0.5)
Eosinophils Relative: 0 %
HCT: 45.4 % (ref 39.0–52.0)
Hemoglobin: 15.4 g/dL (ref 13.0–17.0)
Immature Granulocytes: 1 %
Lymphocytes Relative: 4 %
Lymphs Abs: 0.8 10*3/uL (ref 0.7–4.0)
MCH: 32 pg (ref 26.0–34.0)
MCHC: 33.9 g/dL (ref 30.0–36.0)
MCV: 94.4 fL (ref 80.0–100.0)
Monocytes Absolute: 1.7 10*3/uL — ABNORMAL HIGH (ref 0.1–1.0)
Monocytes Relative: 8 %
Neutro Abs: 18.9 10*3/uL — ABNORMAL HIGH (ref 1.7–7.7)
Neutrophils Relative %: 87 %
Platelets: 184 10*3/uL (ref 150–400)
RBC: 4.81 MIL/uL (ref 4.22–5.81)
RDW: 13 % (ref 11.5–15.5)
WBC: 21.7 10*3/uL — ABNORMAL HIGH (ref 4.0–10.5)
nRBC: 0 % (ref 0.0–0.2)

## 2018-10-12 LAB — COMPREHENSIVE METABOLIC PANEL
ALT: 29 U/L (ref 0–44)
AST: 29 U/L (ref 15–41)
Albumin: 3.8 g/dL (ref 3.5–5.0)
Alkaline Phosphatase: 70 U/L (ref 38–126)
Anion gap: 12 (ref 5–15)
BUN: 16 mg/dL (ref 8–23)
CO2: 23 mmol/L (ref 22–32)
Calcium: 8.8 mg/dL — ABNORMAL LOW (ref 8.9–10.3)
Chloride: 95 mmol/L — ABNORMAL LOW (ref 98–111)
Creatinine, Ser: 1.01 mg/dL (ref 0.61–1.24)
GFR calc Af Amer: >60 mL/min (ref >60)
GFR calc non Af Amer: >60 mL/min (ref >60)
Glucose, Bld: 168 mg/dL — ABNORMAL HIGH (ref 70–99)
Potassium: 4.1 mmol/L (ref 3.5–5.1)
Sodium: 130 mmol/L — ABNORMAL LOW (ref 135–145)
Total Bilirubin: 2.4 mg/dL — ABNORMAL HIGH (ref 0.3–1.2)
Total Protein: 7.7 g/dL (ref 6.5–8.1)

## 2018-10-12 LAB — SARS CORONAVIRUS 2 BY RT PCR (HOSPITAL ORDER, PERFORMED IN ~~LOC~~ HOSPITAL LAB): SARS Coronavirus 2: NEGATIVE

## 2018-10-12 MED ORDER — LEVOFLOXACIN 750 MG PO TABS: 750.0000 mg | ORAL_TABLET | Freq: Every day | ORAL | 0 refills | Status: DC

## 2018-10-12 MED ORDER — ACETAMINOPHEN 325 MG PO TABS
650.0000 mg | ORAL_TABLET | Freq: Once | ORAL | Status: AC
  Administered 2018-10-12: 650 mg via ORAL
  Filled 2018-10-12: qty 2

## 2018-10-12 NOTE — ED Provider Notes (Signed)
[facility] DEPT Provider Note   CSN: [id number] Arrival date & time: 10/12/18  1601     History   Chief Complaint Chief Complaint  Patient presents with  . Fever    HPI Steven Larsen is a 68 y.o. male with a hx of CAD, HTN, & hypercholesterolemia who presents to the ED with complaints of fever x 4 days. Patient reports waxing/waning fever with temp max of 102. Taking tylenol @ home with improvement, last taken @ 0600 this AM. No other alleviating/aggravating factors.Had 1 loose stool this AM, has had very minimal cough, but otherwise no significant associated sxs. Denies URI sxs, dyspnea, chest pain, vomiting, abdominal pain, dysuria, or rashes. Denies tick exposures. Denies sick contacts w/ similar sxs. Has wife who is chronically ill @ home.      HPI  Past Medical History:  Diagnosis Date  . CAD (coronary artery disease)    11/18/17 PCI/DES mLAD, normal EF  . Diverticulosis   . High cholesterol   . History of kidney stones   . Hypertension   . Internal hemorrhoid     Patient Active Problem List   Diagnosis Date Noted  . Hyperlipidemia 11/19/2017  . Hypertension 11/19/2017  . Unstable angina ([place]) 11/12/2017    Past Surgical History:  Procedure Laterality Date  . LEFT HEART CATH AND CORONARY ANGIOGRAPHY N/A 11/18/2017   Procedure: LEFT HEART CATH AND CORONARY ANGIOGRAPHY;  Surgeon: Troy Sine, MD;  Location: [facility];  Service: Cardiovascular;  Laterality: N/A;        Home Medications    Prior to Admission medications   Medication Sig Start Date End Date Taking? Authorizing Provider  acetaminophen (TYLENOL) 500 MG tablet Take 500 mg by mouth as needed.    [provider]  aspirin EC 81 MG tablet Take 81 mg by mouth every other day.     [provider]  atorvastatin (LIPITOR) 80 MG tablet TAKE 1 TABLET BY MOUTH EVERY DAY AT [facility] 02/22/18   Hilty, Nadean Corwin, MD  LYSINE PO Take by mouth.    [provider]  metoprolol succinate (TOPROL-XL) 50 MG 24 hr tablet TAKE 1 TABLET BY MOUTH EVERY DAY 02/22/18   Hilty, Nadean Corwin, MD  nitroGLYCERIN (NITROSTAT) 0.4 MG SL tablet Place 1 tablet (0.4 mg total) under the tongue every 5 (five) minutes as needed. 11/19/17   Cheryln Manly, NP  OVER THE COUNTER MEDICATION Take 1 capsule by mouth daily. Super Beta Prostate     [provider]  OVER THE COUNTER MEDICATION Take 2 capsules by mouth daily.    [provider]  OVER THE COUNTER MEDICATION Take 2 capsules by mouth daily.    [provider]  ticagrelor (BRILINTA) 90 MG TABS tablet Take 1 tablet (90 mg total) by mouth 2 (two) times daily. 06/23/18   Hilty, Nadean Corwin, MD    Family History Family History  Problem Relation Age of Onset  . Prostate cancer Father     Social History Social History   Tobacco Use  . Smoking status: Never Smoker  . Smokeless tobacco: Never Used  Substance Use Topics  . Alcohol use: Never    Frequency: Never  . Drug use: Never     Allergies   Patient has no known allergies.   Review of Systems Review of Systems  Constitutional: Positive for fever.  HENT: Negative for congestion, ear pain and sore throat.   Respiratory: Positive for cough (  minimal). Negative for shortness of breath.   Cardiovascular: Negative for chest pain and leg swelling.  Gastrointestinal: Positive for diarrhea. Negative for abdominal pain, anal bleeding, blood in stool, constipation, nausea and vomiting.  Genitourinary: Negative for dysuria.  Skin: Negative for rash.  Neurological: Negative for syncope.  All other systems reviewed and are negative.    Physical Exam Updated Vital Signs BP 122/66 (BP Location: Right Arm)   Pulse 72   Temp 100.3 F (37.9 C) (Oral)   Resp 18   Ht 5\' 9"  (1.753 m)   Wt 95.3 kg Comment: weighed this morning  SpO2 100%   BMI 31.01 kg/m   Physical Exam Vitals signs and nursing note reviewed.  Constitutional:       General: He is not in acute distress.    Appearance: He is well-developed. He is not toxic-appearing.  HENT:     Head: Normocephalic and atraumatic.  Eyes:     General:        Right eye: No discharge.        Left eye: No discharge.     Conjunctiva/sclera: Conjunctivae normal.  Neck:     Musculoskeletal: Neck supple.  Cardiovascular:     Rate and Rhythm: Normal rate and regular rhythm.  Pulmonary:     Effort: Pulmonary effort is normal. No respiratory distress.     Breath sounds: Normal breath sounds. No wheezing, rhonchi or rales.  Abdominal:     General: There is no distension.     Palpations: Abdomen is soft.     Tenderness: There is no abdominal tenderness. There is no guarding or rebound.  Skin:    General: Skin is warm and dry.     Findings: No rash.  Neurological:     Mental Status: He is alert.     Comments: Clear speech.   Psychiatric:        Behavior: Behavior normal.     ED Treatments / Results  Labs (all labs ordered are listed, but only abnormal results are displayed) Labs Reviewed  CBC WITH DIFFERENTIAL/PLATELET - Abnormal; Notable for the following components:      Result Value   WBC 21.7 (*)    Neutro Abs 18.9 (*)    Monocytes Absolute 1.7 (*)    Abs Immature Granulocytes 0.31 (*)    All other components within normal limits  COMPREHENSIVE METABOLIC PANEL - Abnormal; Notable for the following components:   Sodium 130 (*)    Chloride 95 (*)    Glucose, Bld 168 (*)    Calcium 8.8 (*)    Total Bilirubin 2.4 (*)    All other components within normal limits  SARS CORONAVIRUS 2 (HOSPITAL ORDER, [facility])  CULTURE, BLOOD (ROUTINE X 2)  CULTURE, BLOOD (ROUTINE X 2)    EKG None  Radiology Dg Chest Portable 1 View  Result Date: 10/12/2018 CLINICAL DATA:  Fever. EXAM: PORTABLE CHEST 1 VIEW COMPARISON:  11/12/2017. FINDINGS: Mediastinum hilar structures normal. Heart size stable. Right medial base infiltrate suggesting  pneumonia. Close follow-up exam suggested demonstrate clearing. No pleural effusion or pneumothorax. IMPRESSION: Medial right base infiltrate could most consistent with pneumonia. Followup PA and lateral chest X-ray is recommended in 3-4 weeks following trial of antibiotic therapy to ensure resolution and exclude underlying malignancy. Electronically Signed   By: Marcello Moores  Register   On: 10/12/2018 11:55    Procedures Procedures (including critical care time)  Medications Ordered in ED Medications  acetaminophen (TYLENOL) tablet 650  mg (has no administration in time range)     Initial Impression / Assessment and Plan / ED Course  I have reviewed the triage vital signs and the nursing notes.  Pertinent labs & imaging results that were available during my care of the patient were reviewed by me and considered in my medical decision making (see chart for details).   Patient presents to the emergency department with fever as well as associated mild cough and one episode of nonbloody diarrhea.  Patient is nontoxic-appearing, no apparent distress, vitals WNL, however temp borderline at 100.3 orally.  Will obtain blood work including blood cultures as well as chest x-ray for further assessment.  Plan for rapid COVID swab.  CBC: Leukocytosis at 21.7 with left shift.  No anemia. CMP: Mild electrolyte abnormalities as above, no significant derangement.  Hyperglycemia present.  Renal function and liver function preserved. Rapid COVID test: Negative Chest x-ray: Medial right base infiltrate could most consistent with pneumonia. Followup PA and lateral chest X-ray is recommended in 3-4 weeks following trial of antibiotic therapy to ensure resolution and exclude underlying malignancy.  At this time suspect patient symptoms are related to community-acquired pneumonia.  Patient is nontoxic-appearing, resting comfortably, I personally ambulated the patient with SPO2 maintaining 98 to 100% on room air.  He would  like to go home.  He appears appropriate for trial of outpatient antibiotics.  We will treat with Levaquin.  His rapid flu swab is negative, however I did instruct that he continue quarantine precautions.  We will have her follow-up closely with his primary care provider, we discussed need for repeat imaging, we also discussed extremely strict return precautions.I discussed results, treatment plan, need for follow-up, and return precautions with the patient. Provided opportunity for questions, patient confirmed understanding and is in agreement with plan.    Findings and plan of care discussed with supervising physician Dr. Venora Maples who is in agreement.   Steven Larsen was evaluated in Emergency Department on 10/12/2018 for the symptoms described in the history of present illness. He/she was evaluated in the context of the global COVID-19 pandemic, which necessitated consideration that the patient might be at risk for infection with the SARS-CoV-2 virus that causes COVID-19. Institutional protocols and algorithms that pertain to the evaluation of patients at risk for COVID-19 are in a state of rapid change based on information released by regulatory bodies including the CDC and federal and state organizations. These policies and algorithms were followed during the patient's care in the ED.   Final Clinical Impressions(s) / ED Diagnoses   Final diagnoses:  Community acquired pneumonia of right lower lobe of lung [facility])    ED Discharge Orders         Ordered    levofloxacin (LEVAQUIN) 750 MG tablet  Daily     10/12/18 [address], PA-C 10/12/18 1443    Jola Schmidt, MD 10/12/18 1454

## 2018-10-12 NOTE — ED Notes (Signed)
An After Visit Summary was printed and given to the patient. Discharge instructions given and no further questions at this time.  

## 2018-10-12 NOTE — ED Triage Notes (Signed)
Per pt, states he has been running a fever since Saturday-states he has been taking Tylenol-states it lowers temp for a few hours-states he had some loose stool this am, first time since fever started-states he went an got Covid tested this am, went to [facility] and they sent him here-educated patient with regards to self quarantine, fluids etc-patient asked about a 3 hour test we might have, informed patient he will need to wait for his results

## 2018-10-12 NOTE — Telephone Encounter (Signed)
Pt's wife calling,  Pt present.  Spoke with pt yesterday as well.  Pt with fever, onset Saturday. Max as of yesterday 101.7.  States this AM 102.1  Has been taking tylenol ATC.  No other symptoms. Pt with cardiac history; stents placed last year.Just went for Covid testing.   Advised to go to ED. States will follow disposition.  Does not have PCP presently.  Reason for Disposition . [1] Fever > 101 F (38.3 C) AND [2] age > 60  Protocols used: FEVER-A-AH

## 2018-10-12 NOTE — Discharge Instructions (Signed)
You were seen in the emergency department today for a fever.  Your rapid COVID swab was negative, however we would like you to continue quarantining precautionary only.  Please quarantine as discussed below.  Your chest x-ray showed pneumonia, this is an infection of the lungs, we are placing you on Levaquin, an antibiotic to treat this infection.  Please take the antibiotic as prescribed.  We have prescribed you new medication(s) today. Discuss the medications prescribed today with your pharmacist as they can have adverse effects and interactions with your other medicines including over the counter and prescribed medications. Seek medical evaluation if you start to experience new or abnormal symptoms after taking one of these medicines, seek care immediately if you start to experience difficulty breathing, feeling of your throat closing, facial swelling, or rash as these could be indications of a more serious allergic reaction  This antibiotic additionally makes you at risk for tendon injuries, please be sure to avoid quick movements or running.   Please continue to take Tylenol per over-the-counter dosing to assist with fever control.  Your labs show that your white blood cell count is elevated consistent with your known infection. We checked blood cultures, if these return positive we will call you and ask you to return to the hospital.  We are asking people with potential covid 19 to quarantine themselves for 14 days. You may be able to discontinue self quarantine if the following conditions are met:   Persons with COVID-19 who have symptoms and were directed to care for themselves at home may discontinue home isolation under the  following conditions: - It has been at least 7 days have passed since symptoms first appeared. - AND at least 3 days (72 hours) have passed since recovery defined as resolution of fever without the use of fever-reducing medications and improvement in respiratory  symptoms (e.g., cough, shortness of breath)  Please follow the below quarantine instructions.   Please follow up with primary care within 3-5 days for re-evaluation- call prior to going to the office to make them aware of your symptoms. Return to the ER for new or worsening symptoms including but not limited to increased work of breathing, chest pain, passing out, or any other concerns.       Person Under Monitoring Name: Steven Larsen  Location: [place] [address]   Infection Prevention Recommendations for Individuals Confirmed to have, or Being Evaluated for, 2019 Novel Coronavirus (COVID-19) Infection Who Receive Care at Home  Individuals who are confirmed to have, or are being evaluated for, COVID-19 should follow the prevention steps below until a healthcare provider or local or state health department says they can return to normal activities.  Stay home except to get medical care You should restrict activities outside your home, except for getting medical care. Do not go to work, school, or public areas, and do not use public transportation or taxis.  Call ahead before visiting your doctor Before your medical appointment, call the healthcare provider and tell them that you have, or are being evaluated for, COVID-19 infection. This will help the healthcare providers office take steps to keep other people from getting infected. Ask your healthcare provider to call the local or state health department.  Monitor your symptoms Seek prompt medical attention if your illness is worsening (e.g., difficulty breathing). Before going to your medical appointment, call the healthcare provider and tell them that you have, or are being evaluated for, COVID-19 infection. Ask your  healthcare provider to call the local or state health department.  Wear a facemask You should wear a facemask that covers your nose and mouth when you are in the same room with other people  and when you visit a healthcare provider. People who live with or visit you should also wear a facemask while they are in the same room with you.  Separate yourself from other people in your home As much as possible, you should stay in a different room from other people in your home. Also, you should use a separate bathroom, if available.  Avoid sharing household items You should not share dishes, drinking glasses, cups, eating utensils, towels, bedding, or other items with other people in your home. After using these items, you should wash them thoroughly with soap and water.  Cover your coughs and sneezes Cover your mouth and nose with a tissue when you cough or sneeze, or you can cough or sneeze into your sleeve. Throw used tissues in a lined trash can, and immediately wash your hands with soap and water for at least 20 seconds or use an alcohol-based hand rub.  Wash your [facility] your hands often and thoroughly with soap and water for at least 20 seconds. You can use an alcohol-based hand sanitizer if soap and water are not available and if your hands are not visibly dirty. Avoid touching your eyes, nose, and mouth with unwashed hands.   Prevention Steps for Caregivers and Household Members of Individuals Confirmed to have, or Being Evaluated for, COVID-19 Infection Being Cared for in the Home  If you live with, or provide care at home for, a person confirmed to have, or being evaluated for, COVID-19 infection please follow these guidelines to prevent infection:  Follow healthcare providers instructions Make sure that you understand and can help the patient follow any healthcare provider instructions for all care.  Provide for the patients basic needs You should help the patient with basic needs in the home and provide support for getting groceries, prescriptions, and other personal needs.  Monitor the patients symptoms If they are getting sicker, call his or her medical  provider and tell them that the patient has, or is being evaluated for, COVID-19 infection. This will help the healthcare providers office take steps to keep other people from getting infected. Ask the healthcare provider to call the local or state health department.  Limit the number of people who have contact with the patient If possible, have only one caregiver for the patient. Other household members should stay in another home or place of residence. If this is not possible, they should stay in another room, or be separated from the patient as much as possible. Use a separate bathroom, if available. Restrict visitors who do not have an essential need to be in the home.  Keep older adults, very young children, and other sick people away from the patient Keep older adults, very young children, and those who have compromised immune systems or chronic health conditions away from the patient. This includes people with chronic heart, lung, or kidney conditions, diabetes, and cancer.  Ensure good ventilation Make sure that shared spaces in the home have good air flow, such as from an air conditioner or an opened window, weather permitting.  Wash your hands often Wash your hands often and thoroughly with soap and water for at least 20 seconds. You can use an alcohol based hand sanitizer if soap and water are not available and if your  hands are not visibly dirty. Avoid touching your eyes, nose, and mouth with unwashed hands. Use disposable paper towels to dry your hands. If not available, use dedicated cloth towels and replace them when they become wet.  Wear a facemask and gloves Wear a disposable facemask at all times in the room and gloves when you touch or have contact with the patients blood, body fluids, and/or secretions or excretions, such as sweat, saliva, sputum, nasal mucus, vomit, urine, or feces.  Ensure the mask fits over your nose and mouth tightly, and do not touch it during  use. Throw out disposable facemasks and gloves after using them. Do not reuse. Wash your hands immediately after removing your facemask and gloves. If your personal clothing becomes contaminated, carefully remove clothing and launder. Wash your hands after handling contaminated clothing. Place all used disposable facemasks, gloves, and other waste in a lined container before disposing them with other household waste. Remove gloves and wash your hands immediately after handling these items.  Do not share dishes, glasses, or other household items with the patient Avoid sharing household items. You should not share dishes, drinking glasses, cups, eating utensils, towels, bedding, or other items with a patient who is confirmed to have, or being evaluated for, COVID-19 infection. After the person uses these items, you should wash them thoroughly with soap and water.  Wash laundry thoroughly Immediately remove and wash clothes or bedding that have blood, body fluids, and/or secretions or excretions, such as sweat, saliva, sputum, nasal mucus, vomit, urine, or feces, on them. Wear gloves when handling laundry from the patient. Read and follow directions on labels of laundry or clothing items and detergent. In general, wash and dry with the warmest temperatures recommended on the label.  Clean all areas the individual has used often Clean all touchable surfaces, such as counters, tabletops, doorknobs, bathroom fixtures, toilets, phones, keyboards, tablets, and bedside tables, every day. Also, clean any surfaces that may have blood, body fluids, and/or secretions or excretions on them. Wear gloves when cleaning surfaces the patient has come in contact with. Use a diluted bleach solution (e.g., dilute bleach with 1 part bleach and 10 parts water) or a household disinfectant with a label that says EPA-registered for coronaviruses. To make a bleach solution at home, add 1 tablespoon of bleach to 1 quart (4  cups) of water. For a larger supply, add  cup of bleach to 1 gallon (16 cups) of water. Read labels of cleaning products and follow recommendations provided on product labels. Labels contain instructions for safe and effective use of the cleaning product including precautions you should take when applying the product, such as wearing gloves or eye protection and making sure you have good ventilation during use of the product. Remove gloves and wash hands immediately after cleaning.  Monitor yourself for signs and symptoms of illness Caregivers and household members are considered close contacts, should monitor their health, and will be asked to limit movement outside of the home to the extent possible. Follow the monitoring steps for close contacts listed on the symptom monitoring form.   ? If you have additional questions, contact your local health department or call the epidemiologist on call at [phone] (available 24/7). ? This guidance is subject to change. For the most up-to-date guidance from [facility], please refer to their website: YouBlogs.pl

## 2018-10-14 LAB — NOVEL CORONAVIRUS, NAA: SARS-CoV-2, NAA: NOT DETECTED

## 2018-10-17 LAB — CULTURE, BLOOD (ROUTINE X 2)
Culture: NO GROWTH
Special Requests: ADEQUATE

## 2018-11-11 ENCOUNTER — Telehealth: Payer: Self-pay | Admitting: General Practice

## 2018-11-11 NOTE — Telephone Encounter (Signed)
Please advise 

## 2018-11-11 NOTE — Telephone Encounter (Signed)
Okay to schedule NP appt at his convenience.  

## 2018-11-11 NOTE — Telephone Encounter (Signed)
Steven Larsen VO:[phone] patient of Dr. Lorelei Pont stating Dr. Lorelei Pont approved to take spouse on as a new patient, patient is need of hospital follow up, please advise best # [phone]

## 2018-11-15 ENCOUNTER — Encounter: Payer: Self-pay | Admitting: Internal Medicine

## 2018-11-17 ENCOUNTER — Other Ambulatory Visit: Payer: Self-pay | Admitting: Internal Medicine

## 2018-11-23 NOTE — Progress Notes (Addendum)
[place] at [facility], [place], [place] [address] [phone]  Date:  11/25/2018   Name:  Steven Larsen   DOB:  07-25-1950   MRN:  [id number]  PCP:  Darreld Mclean, MD    Chief Complaint: New Patient (Initial Visit) (follow up-needs xray to follow up on pneumonia ) and Flu Vaccine (delcines flu shot)   History of Present Illness:  Steven Larsen is a 68 y.o. 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  He now feels fine, no chest pain with fairly vigorous exercise He has worked on changing his diet as well He was seen in the ER this past July with pneumonia-he was discharged home with antibiotics. He has recovered fully.  He reports that he was asked to get a repeat chest x-ray in about 1 month-can do today  In his free time he enjoys going to their farm in [place] He has been a Development worker, community for about 50 years   Most recent visit with cardiology was in December: ASSESSMENT: 1. Coronary artery disease status post PCI to the mid LAD (11/2017) 2. Dyslipidemia 3. Hypertension PLAN: 1.   [name] has done well status post left heart cath in September with stent to the LAD.  He denies any further chest pain or worsening shortness of breath.  I commended him on his weight loss and dietary changes.  He has had marked improvement in his lipid profile on high potency statin.  I suspect his hemoglobin A1c will come down further.  We will plan to continue his current medications and see him back in September of next year, at which time we  may be able to discontinue Brilinta.  Colon cancer screening: pt is under care of Dr. Jaclynn Guarneri plan a colonoscopy soon Tetanus vaccine: will update for him today  Flu shot: declines  Pneumonia series: declines today He had a CMP and CBC in July when he was sick with pneumonia-needs routine labs today Patient Active Problem List   Diagnosis Date Noted  . Pre-diabetes 11/25/2018  . Hyperlipidemia 11/19/2017  . Hypertension 11/19/2017  . Unstable angina ([place]) 11/12/2017    Past Medical History:  Diagnosis Date  . CAD (coronary artery disease)    11/18/17 PCI/DES mLAD, normal EF  . High cholesterol   . History of Larsen stones   . Hypertension   . Internal hemorrhoid     Past Surgical History:  Procedure Laterality Date  . LEFT HEART CATH AND CORONARY ANGIOGRAPHY N/A 11/18/2017   Procedure: LEFT HEART CATH AND CORONARY ANGIOGRAPHY;  Surgeon: Troy Sine, MD;  Location: [facility];  Service: Cardiovascular;  Laterality: N/A;    Social History   Tobacco Use  . Smoking status: Never Smoker  . Smokeless tobacco: Never Used  Substance Use Topics  . Alcohol use: Never    Frequency: Never  . Drug use: Never    Family History  Problem Relation Age of Onset  . Prostate cancer Father     No Known Allergies  Medication list has been reviewed and updated.  Current Outpatient Medications on File Prior to Visit  Medication Sig Dispense Refill  . acetaminophen (TYLENOL) 500 MG tablet Take 500 mg by mouth as needed.    Marland Kitchen aspirin EC 81 MG tablet Take 81 mg by mouth daily.     Marland Kitchen atorvastatin (LIPITOR) 80 MG tablet Take 1 tablet (80 mg total) by mouth daily at 6 PM. *PLEASE CALL TO SCHEDULE AN APPOINTMENT* 90 tablet 0  . levofloxacin (LEVAQUIN) 750 MG tablet Take 1 tablet (750 mg total) by mouth daily. 7 tablet 0  . LYSINE PO Take by mouth.    . metoprolol succinate (TOPROL-XL) 50 MG 24 hr tablet Take 1 tablet (50 mg total) by mouth daily. *PLEASE CALL TO SCHEDULE AN  APPOINTMENT* 90 tablet 0  . nitroGLYCERIN (NITROSTAT) 0.4 MG SL tablet Place 1 tablet (0.4 mg total) under the tongue every 5 (five) minutes as needed. 25 tablet 0  . OVER THE COUNTER MEDICATION Take 1 capsule by mouth daily. Super Beta Prostate     . OVER THE COUNTER MEDICATION Take 2 capsules by mouth daily.    Marland Kitchen OVER THE COUNTER MEDICATION Take 2 capsules by mouth daily.    . ticagrelor (BRILINTA) 90 MG TABS tablet Take 1 tablet (90 mg total) by mouth 2 (two) times daily. 180 tablet 3   No current facility-administered medications on file prior to visit.     Review of Systems:  As per HPI- otherwise negative. No chest pain He has not need his nitro He notes that home BP is generally 120/70s approx   Physical Examination: Vitals:   11/25/18 1442  BP: 132/80  Pulse: 74  Resp: 16  Temp: 97.8 F (36.6 C)  SpO2: 98%   Vitals:   11/25/18 1442  Weight: 210 lb (95.3 kg)  Height: '5\' 9"'$  (1.753 m)   Body mass index is 31.01 kg/m. Ideal Body Weight: Weight in (lb) to have BMI = 25: 168.9  GEN: WDWN, NAD, Non-toxic, A & O x 3, obese, looks well HEENT: Atraumatic, Normocephalic. Neck supple. No masses, No LAD. Ears and Nose: No external deformity. CV: RRR, No M/G/R. No JVD. No thrill. No extra heart sounds. PULM: CTA B, no wheezes, crackles, rhonchi. No retractions. No resp. distress. No accessory muscle use. ABD: S, NT, ND, +BS. No rebound. No HSM. EXTR: No c/c/e NEURO Normal gait.  PSYCH: Normally interactive. Conversant. Not depressed or anxious appearing.  Calm demeanor.  Right arm displays a healing superficial laceration-does not need sutures but does necessitate a tetanus booster   Assessment and Plan: Unstable angina ([place])  Pre-diabetes - Plan: Comprehensive metabolic panel, Hemoglobin A1c  Pure hypercholesterolemia - Plan: Lipid panel  Essential hypertension - Plan: CBC, Comprehensive metabolic panel  Screening for deficiency anemia - Plan: CBC  Screening for  prostate cancer - Plan: PSA, Medicare ( [facility] only)  Encounter for hepatitis C screening test for low risk patient - Plan: Hepatitis C antibody  History of pneumonia - Plan: DG Chest 2 View  Immunization due - Plan: Td vaccine greater than or equal to 7yo preservative free IM  Laceration of right upper extremity, initial encounter - Plan: Td vaccine greater than or equal to 7yo preservative free IM  Establishing care today with myself as primary care doctor.  He is already under the care of cardiology and GI Updated tetanus today Routine labs pending as above Angina has resolved following cardiac stent last year Repeat chest x-ray  today to follow-up on pneumonia seen over this past summer Patient declines flu and pneumonia vaccines today Will plan further follow- up pending labs.   Signed Lamar Blinks, MD  Received chest film- message to pt   Dg Chest 2 View  Result Date: 11/25/2018 CLINICAL DATA:  Follow-up pneumonia.  Asymptomatic. EXAM: CHEST - 2 VIEW COMPARISON:  10/12/2018 FINDINGS: Lungs are adequately inflated without consolidation or effusion. Cardiomediastinal silhouette and remainder the exam is unchanged. IMPRESSION: No active cardiopulmonary disease. Electronically Signed   By: Marin Olp M.D.   On: 11/25/2018 15:39   Received his labs 9/11- message to pt  Results for orders placed or performed in visit on 11/25/18  CBC  Result Value Ref Range   WBC 7.1 4.0 - 10.5 K/uL   RBC 4.33 4.22 - 5.81 Mil/uL   Platelets 221.0 150.0 - 400.0 K/uL   Hemoglobin 14.0 13.0 - 17.0 g/dL   HCT 40.9 39.0 - 52.0 %   MCV 94.5 78.0 - 100.0 fl   MCHC 34.1 30.0 - 36.0 g/dL   RDW 13.9 11.5 - 15.5 %  Comprehensive metabolic panel  Result Value Ref Range   Sodium 141 135 - 145 mEq/L   Potassium 4.3 3.5 - 5.1 mEq/L   Chloride 106 96 - 112 mEq/L   CO2 28 19 - 32 mEq/L   Glucose, Bld 100 (H) 70 - 99 mg/dL   BUN 22 6 - 23 mg/dL   Creatinine, Ser 0.87 0.40 - 1.50 mg/dL    Total Bilirubin 1.2 0.2 - 1.2 mg/dL   Alkaline Phosphatase 75 39 - 117 U/L   AST 19 0 - 37 U/L   ALT 21 0 - 53 U/L   Total Protein 6.4 6.0 - 8.3 g/dL   Albumin 4.2 3.5 - 5.2 g/dL   Calcium 9.4 8.4 - 10.5 mg/dL   GFR 87.60 >60.00 mL/min  Lipid panel  Result Value Ref Range   Cholesterol 127 0 - 200 mg/dL   Triglycerides 102.0 0.0 - 149.0 mg/dL   HDL 47.40 >39.00 mg/dL   VLDL 20.4 0.0 - 40.0 mg/dL   LDL Cholesterol 60 0 - 99 mg/dL   Total CHOL/HDL Ratio 3    NonHDL 80.02   Hemoglobin A1c  Result Value Ref Range   Hgb A1c MFr Bld 6.1 4.6 - 6.5 %  PSA, Medicare ( [facility] only)  Result Value Ref Range   PSA 1.26 0.10 - 4.00 ng/ml

## 2018-11-24 ENCOUNTER — Other Ambulatory Visit: Payer: Self-pay

## 2018-11-25 ENCOUNTER — Ambulatory Visit (HOSPITAL_BASED_OUTPATIENT_CLINIC_OR_DEPARTMENT_OTHER)
Admission: RE | Admit: 2018-11-25 | Discharge: 2018-11-25 | Disposition: A | Discharge status: Home / Self Care | Payer: Medicare Other | Source: Ambulatory Visit | Attending: Family Medicine | Admitting: Family Medicine

## 2018-11-25 ENCOUNTER — Ambulatory Visit (INDEPENDENT_AMBULATORY_CARE_PROVIDER_SITE_OTHER): Payer: Medicare Other | Admitting: Family Medicine

## 2018-11-25 ENCOUNTER — Encounter: Payer: Self-pay | Admitting: Family Medicine

## 2018-11-25 VITALS — BP 132/80 | HR 74 | Temp 97.8°F | Resp 16 | Ht 69.0 in | Wt 210.0 lb

## 2018-11-25 DIAGNOSIS — E7800 Pure hypercholesterolemia, unspecified: Secondary | ICD-10-CM | POA: Diagnosis not present

## 2018-11-25 DIAGNOSIS — Z8701 Personal history of pneumonia (recurrent): Secondary | ICD-10-CM | POA: Diagnosis not present

## 2018-11-25 DIAGNOSIS — R7303 Prediabetes: Secondary | ICD-10-CM | POA: Insufficient documentation

## 2018-11-25 DIAGNOSIS — I10 Essential (primary) hypertension: Secondary | ICD-10-CM

## 2018-11-25 DIAGNOSIS — I200 Unstable angina: Secondary | ICD-10-CM | POA: Diagnosis not present

## 2018-11-25 DIAGNOSIS — J189 Pneumonia, unspecified organism: Secondary | ICD-10-CM | POA: Diagnosis not present

## 2018-11-25 DIAGNOSIS — S41111A Laceration without foreign body of right upper arm, initial encounter: Secondary | ICD-10-CM | POA: Diagnosis not present

## 2018-11-25 DIAGNOSIS — Z130 Encounter for screening for diseases of the blood and blood-forming organs and certain disorders involving the immune mechanism: Secondary | ICD-10-CM | POA: Diagnosis not present

## 2018-11-25 DIAGNOSIS — Z1159 Encounter for screening for other viral diseases: Secondary | ICD-10-CM

## 2018-11-25 DIAGNOSIS — Z23 Encounter for immunization: Secondary | ICD-10-CM

## 2018-11-25 DIAGNOSIS — Z125 Encounter for screening for malignant neoplasm of prostate: Secondary | ICD-10-CM

## 2018-11-25 NOTE — Patient Instructions (Addendum)
It was very nice to see you today, I will be in touch with your labs ASAP You might consider getting the shingles vaccine series, called Shingrix, at your drugstore at your convenience You got a tetanus booster today After your labs today, please stop by the imaging dept on the ground floor for your recheck chest film  Take care!

## 2018-11-26 ENCOUNTER — Encounter: Payer: Self-pay | Admitting: Family Medicine

## 2018-11-26 LAB — COMPREHENSIVE METABOLIC PANEL
ALT: 21 U/L (ref 0–53)
AST: 19 U/L (ref 0–37)
Albumin: 4.2 g/dL (ref 3.5–5.2)
Alkaline Phosphatase: 75 U/L (ref 39–117)
BUN: 22 mg/dL (ref 6–23)
CO2: 28 mEq/L (ref 19–32)
Calcium: 9.4 mg/dL (ref 8.4–10.5)
Chloride: 106 mEq/L (ref 96–112)
Creatinine, Ser: 0.87 mg/dL (ref 0.40–1.50)
GFR: 87.6 mL/min (ref >60.00)
Glucose, Bld: 100 mg/dL — ABNORMAL HIGH (ref 70–99)
Potassium: 4.3 mEq/L (ref 3.5–5.1)
Sodium: 141 mEq/L (ref 135–145)
Total Bilirubin: 1.2 mg/dL (ref 0.2–1.2)
Total Protein: 6.4 g/dL (ref 6.0–8.3)

## 2018-11-26 LAB — LIPID PANEL
Cholesterol: 127 mg/dL (ref 0–200)
HDL: 47.4 mg/dL (ref >39.00)
LDL Cholesterol: 60 mg/dL (ref 0–99)
NonHDL: 80.02
Total CHOL/HDL Ratio: 3
Triglycerides: 102 mg/dL (ref 0.0–149.0)
VLDL: 20.4 mg/dL (ref 0.0–40.0)

## 2018-11-26 LAB — CBC
HCT: 40.9 % (ref 39.0–52.0)
Hemoglobin: 14 g/dL (ref 13.0–17.0)
MCHC: 34.1 g/dL (ref 30.0–36.0)
MCV: 94.5 fl (ref 78.0–100.0)
Platelets: 221 10*3/uL (ref 150.0–400.0)
RBC: 4.33 Mil/uL (ref 4.22–5.81)
RDW: 13.9 % (ref 11.5–15.5)
WBC: 7.1 10*3/uL (ref 4.0–10.5)

## 2018-11-26 LAB — HEMOGLOBIN A1C: Hgb A1c MFr Bld: 6.1 % (ref 4.6–6.5)

## 2018-11-26 LAB — PSA, MEDICARE: PSA: 1.26 ng/ml (ref 0.10–4.00)

## 2018-11-29 ENCOUNTER — Encounter: Payer: Self-pay | Admitting: *Deleted

## 2018-11-29 LAB — HEPATITIS C ANTIBODY
Hepatitis C Ab: NONREACTIVE
SIGNAL TO CUT-OFF: 0.01 (ref <1.00)

## 2018-12-02 ENCOUNTER — Encounter: Payer: Self-pay | Admitting: Medical

## 2018-12-02 ENCOUNTER — Ambulatory Visit (INDEPENDENT_AMBULATORY_CARE_PROVIDER_SITE_OTHER): Payer: Medicare Other | Admitting: Medical

## 2018-12-02 ENCOUNTER — Other Ambulatory Visit: Payer: Self-pay

## 2018-12-02 VITALS — BP 138/78 | HR 67 | Ht 69.0 in | Wt 214.5 lb

## 2018-12-02 DIAGNOSIS — E7800 Pure hypercholesterolemia, unspecified: Secondary | ICD-10-CM | POA: Diagnosis not present

## 2018-12-02 DIAGNOSIS — I10 Essential (primary) hypertension: Secondary | ICD-10-CM

## 2018-12-02 DIAGNOSIS — R7303 Prediabetes: Secondary | ICD-10-CM | POA: Diagnosis not present

## 2018-12-02 DIAGNOSIS — I2510 Atherosclerotic heart disease of native coronary artery without angina pectoris: Secondary | ICD-10-CM | POA: Diagnosis not present

## 2018-12-02 NOTE — Progress Notes (Signed)
Cardiology Office Note   Date:  12/02/2018   ID:  ELDO WOHLER, DOB 09/14/1950, MRN [id number]  PCP:  Darreld Mclean, MD  Cardiologist:  Pixie Casino, MD EP: None  Chief Complaint  Patient presents with  . Follow-up    CAD      History of Present Illness: Steven Larsen is a 68 y.o. male with a PMH of CAD s/p PCI/DES to LAD in 11/2017, HTN, HLD, and pre-DM type 2, who presents for routine follow-up of his CAD.  He was last evaluated by cardiology at an outpatient visit with Dr. Debara Pickett 02/2018, at which time he was doing quite well from a cardiac standpoint. He had resolution of angina with LAD stent 11/2017 and he had lost 20lbs with dietary modifications. No medication changes occurred and he was recommended to follow-up in 1 year. Per Dr. Debara Pickett, he anticipated being able to discontinue brilinta at his visit 11/2018. Dexter 11/2017 showed single vessle obstructive CAD managed with PCI/DES to LAD; otherwise normal coronary arteries and EF 55%.   He returns today for follow-up of his CAD. 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 He continues to walk regularly without any anginal complaints. He reports having burning/tightness in his throat as his presenting symptom prior to stent placement but denies any recurrent anginal equivalent symptoms. No complaints of palpitations, LE edema, orthopnea, PND, dizziness, lightheadedness, or syncope. His BP has been in the 130s/80 on the past several blood pressure checks. He reports it is occasionally in the 110s/80s at home. We discussed his A1C results placing him in the pre-diabetes range and he was encouraged to maintain a heart healthy, carb conscious diet.     Past Medical History:  Diagnosis Date  . CAD (coronary artery disease)    11/18/17 PCI/DES mLAD, normal EF  . High cholesterol   . History of kidney stones   .  Hypertension   . Internal hemorrhoid     Past Surgical History:  Procedure Laterality Date  . LEFT HEART CATH AND CORONARY ANGIOGRAPHY N/A 11/18/2017   Procedure: LEFT HEART CATH AND CORONARY ANGIOGRAPHY;  Surgeon: Troy Sine, MD;  Location: [facility];  Service: Cardiovascular;  Laterality: N/A;     Current Outpatient Medications  Medication Sig Dispense Refill  . acetaminophen (TYLENOL) 500 MG tablet Take 500 mg by mouth as needed.    Marland Kitchen aspirin EC 81 MG tablet Take 81 mg by mouth daily.     Marland Kitchen atorvastatin (LIPITOR) 80 MG tablet Take 1 tablet (80 mg total) by mouth daily at 6 PM. *PLEASE CALL TO SCHEDULE AN APPOINTMENT* 90 tablet 0  . levofloxacin (LEVAQUIN) 750 MG tablet Take 1 tablet (750 mg total) by mouth daily. 7 tablet 0  . LYSINE PO Take by mouth.    . metoprolol succinate (TOPROL-XL) 50 MG 24 hr tablet Take 1 tablet (50 mg total) by mouth daily. *PLEASE CALL TO SCHEDULE AN APPOINTMENT* 90 tablet 0  . nitroGLYCERIN (NITROSTAT) 0.4 MG SL tablet Place 1 tablet (0.4 mg total) under the tongue every 5 (five) minutes as needed. 25 tablet 0  . OVER THE COUNTER MEDICATION Take 1 capsule by mouth daily. Super Beta Prostate     . OVER THE COUNTER MEDICATION Take 2 capsules by mouth daily.    Marland Kitchen OVER THE COUNTER MEDICATION Take 2 capsules by  mouth daily.     No current facility-administered medications for this visit.     Allergies:   Patient has no known allergies.    Social History:  The patient  reports that he has never smoked. He has never used smokeless tobacco. He reports that he does not drink alcohol or use drugs.   Family History:  The patient's family history includes Prostate cancer in his father.    ROS:  Please see the history of present illness.   Otherwise, review of systems are positive for none.   All other systems are reviewed and negative.    PHYSICAL EXAM: VS:  BP 138/78   Pulse 67   Ht 5\' 9"  (1.753 m)   Wt 214 lb 8 oz (97.3 kg)   BMI 31.68  kg/m  , BMI Body mass index is 31.68 kg/m. GEN: Well nourished, well developed, in no acute distress HEENT: sclera anicteric Neck: no JVD, carotid bruits, or masses Cardiac: RRR; no murmurs, rubs, or gallops, trace LE edema  Respiratory:  clear to auscultation bilaterally, normal work of breathing GI: soft, obese, nontender, nondistended, + BS MS: no deformity or atrophy Skin: warm and dry, no rash Neuro:  Strength and sensation are intact Psych: euthymic mood, full affect   EKG:  EKG is ordered today. The ekg ordered today demonstrates NSR, rate 67, QTc 418, n o STE/D, no TWI, isolated T wave flattening in aVL; no significant change from previous.    Recent Labs: 12/29/2017: TSH 2.860 11/25/2018: ALT 21; BUN 22; Creatinine, Ser 0.87; Hemoglobin 14.0; Platelets 221.0; Potassium 4.3; Sodium 141    Lipid Panel    Component Value Date/Time   CHOL 127 11/25/2018 1511   CHOL 128 12/29/2017 1607   TRIG 102.0 11/25/2018 1511   HDL 47.40 11/25/2018 1511   HDL 46 12/29/2017 1607   CHOLHDL 3 11/25/2018 1511   VLDL 20.4 11/25/2018 1511   LDLCALC 60 11/25/2018 1511   LDLCALC 55 12/29/2017 1607      Wt Readings from Last 3 Encounters:  12/02/18 214 lb 8 oz (97.3 kg)  11/25/18 210 lb (95.3 kg)  10/12/18 210 lb (95.3 kg)      Other studies Reviewed: Additional studies/ records that were reviewed today include:   Left heart catheterization 11/2017:  Prox LAD to Mid LAD lesion is 95% stenosed.  Ost LAD to Prox LAD lesion is 30% stenosed.  Prox LAD-1 lesion is 20% stenosed.  Prox LAD-2 lesion is 30% stenosed.  A stent was successfully placed.  Post intervention, there is a 0% residual stenosis.  Post intervention, there is a 0% residual stenosis.  The left ventricular systolic function is normal.  LV end diastolic pressure is normal.   Single-vessel coronary obstructive disease in a dominant left coronary system.  There is 30% smooth proximal LAD stenosis before the  takeoff of the first diagonal vessel.  There is a 20% stenosis immediately after the first small diagonal vessel.  In the mid LAD there was 30% proximal and 95% eccentric stenosis immediately after the second diagonal vessel.  The LAD was large caliber wrapped around the apex.  The ramus intermediate, dominant left circumflex vessel, and nondominant RCA vessels were normal.  Global LV function with an ejection fraction at 55%.  LVEDP 13 mm.  There were no focal segmental wall motion abnormalities.  RECOMMENDATION: The patient will be started on dual antiplatelet therapy with aspirin and Brilinta.  High potency statin therapy will be initiated.  The patient  will also be started on beta-blocker therapy.  Optimal blood pressure with target less than 130/80 will be necessary.  Recommend uninterrupted dual antiplatelet therapy with Aspirin 81mg  daily and Ticagrelor 90mg  twice daily for a minimum of 6 months (stable ischemic heart disease - Class I recommendation) but if tolerated would continue for 1 year.    ASSESSMENT AND PLAN:  1. CAD s/p PCI/DES to LAD 11/2017: he is now a year out from stent placement. No recurrent chest pain or SOB since coronary intervention.  - Will stop brilinta at this time - Continue aspirin - Continue statin and metoprolol for risk factor modifications  2. HTN: BP 132/80 today. He has been above goal of 130/80 on several checks recently. Patient prefers to avoid additional medications at this time in favor of dietary/lifestyle modifications to improve his BP. He does have trace LE edema on exam today so if BP is still elevated above goal, would consider addition of HCTZ.  - Continue metoprolol   3. HLD: LDL 60 11/25/2018; at goal of <70 - Continue statin  4. Pre-DM type 2: A1C 6.1 11/25/2018 - Continue dietary and lifestyle modifications to promote improved glycemic control   Current medicines are reviewed at length with the patient today.  The patient does not  have concerns regarding medicines.  The following changes have been made:  Will stop brilinta at this time  Labs/ tests ordered today include:   Orders Placed This Encounter  Procedures  . EKG 12-Lead     Disposition:   FU with Dr. Debara Pickett in 1 year  Signed, Abigail Butts, PA-C  12/02/2018 4:40 PM

## 2018-12-02 NOTE — Patient Instructions (Signed)
Medication Instructions:  STOP Brilinta  If you need a refill on your cardiac medications before your next appointment, please call your pharmacy.    Follow-Up: At [facility], you and your health needs are our priority.  As part of our continuing mission to provide you with exceptional heart care, we have created designated Provider Care Teams.  These Care Teams include your primary Cardiologist (physician) and Advanced Practice Providers (APPs -  Physician Assistants and Nurse Practitioners) who all work together to provide you with the care you need, when you need it. You will need a follow up appointment in 12 months.  Please call our office 2 months in advance to schedule this appointment.  You may see Pixie Casino, MD or one of the following Advanced Practice Providers on your designated Care Team: [place], [place] . Fabian Sharp, PA-C  Any Other Special Instructions Will Be Listed Below (If Applicable). Your physician has requested that you regularly monitor and record your blood pressure readings at home. Please use the same machine at the same time of day to check your readings and record them to bring to your follow-up visit. Please call our office if your blood pressure is consistently above 130/80.

## 2019-01-04 ENCOUNTER — Encounter: Payer: Self-pay | Admitting: Internal Medicine

## 2019-01-04 ENCOUNTER — Ambulatory Visit (INDEPENDENT_AMBULATORY_CARE_PROVIDER_SITE_OTHER): Payer: Medicare Other | Admitting: Internal Medicine

## 2019-01-04 VITALS — BP 124/70 | HR 71 | Temp 98.1°F | Ht 70.0 in | Wt 210.0 lb

## 2019-01-04 DIAGNOSIS — I200 Unstable angina: Secondary | ICD-10-CM

## 2019-01-04 DIAGNOSIS — Z1211 Encounter for screening for malignant neoplasm of colon: Secondary | ICD-10-CM | POA: Diagnosis not present

## 2019-01-04 DIAGNOSIS — Z1159 Encounter for screening for other viral diseases: Secondary | ICD-10-CM | POA: Diagnosis not present

## 2019-01-04 DIAGNOSIS — E669 Obesity, unspecified: Secondary | ICD-10-CM

## 2019-01-04 NOTE — Progress Notes (Signed)
   Steven Larsen 68 y.o. 04/20/1950 [id number]  Assessment & Plan:   Encounter Diagnoses  Name Primary?  . Colon cancer screening Yes  . Obesity (BMI 30.0-34.9)   . Screening for viral disease        Subjective:   Chief Complaint: Colon cancer screening  HPI Is here to discuss colonoscopy.  He had previously been on Brilinta after stenting multivessel coronary artery disease.  No angina problems.  Brilinta was discontinued last month.  Last colonoscopy 2007, Dr. Oletta Lamas.  It was negative.  No active GI symptoms. No Known Allergies Current Meds  Medication Sig  . acetaminophen (TYLENOL) 500 MG tablet Take 500 mg by mouth as needed.  Marland Kitchen aspirin EC 81 MG tablet Take 81 mg by mouth daily.   Marland Kitchen atorvastatin (LIPITOR) 80 MG tablet Take 1 tablet (80 mg total) by mouth daily at 6 PM. *PLEASE CALL TO SCHEDULE AN APPOINTMENT*  . LYSINE PO Take by mouth.  . metoprolol succinate (TOPROL-XL) 50 MG 24 hr tablet Take 1 tablet (50 mg total) by mouth daily. *PLEASE CALL TO SCHEDULE AN APPOINTMENT*  . nitroGLYCERIN (NITROSTAT) 0.4 MG SL tablet Place 1 tablet (0.4 mg total) under the tongue every 5 (five) minutes as needed.  Marland Kitchen OVER THE COUNTER MEDICATION Take 1 capsule by mouth daily. Super Beta Prostate   . OVER THE COUNTER MEDICATION Take 2 capsules by mouth daily.   Past Medical History:  Diagnosis Date  . CAD (coronary artery disease)    11/18/17 PCI/DES mLAD, normal EF  . High cholesterol   . History of kidney stones   . Hypertension   . Internal hemorrhoid    Past Surgical History:  Procedure Laterality Date  . 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   Review of Systems   Objective:   Physical Exam BP 124/70   Pulse 71   Temp 98.1 F (36.7 C)   Ht 5\' 10"  (1.778 m)   Wt 210 lb (95.3 kg)   SpO2 98%   BMI 30.13 kg/m

## 2019-01-04 NOTE — Patient Instructions (Addendum)
  You have been scheduled for a colonoscopy. Please follow written instructions given to you at your visit today.  Please pick up your prep supplies at the pharmacy within the next 1-3 days. If you use inhalers (even only as needed), please bring them with you on the day of your procedure.  Due to recent COVID-19 restrictions implemented by [employer] and state authorities and in an effort to keep both patients and staff as safe as possible, [facility] requires COVID-19 testing prior to any scheduled endoscopic procedure. The testing center is located at [address] [place], [place] [address] in the [facility] [employer]  suite.  Your appointment has been scheduled for 3:30pm on 01/24/2019.   Please bring your insurance cards to this appointment. You will require your COVID screen 2 business days prior to your endoscopic procedure.  You are not required to quarantine after your screening.  You will only receive a phone call with the results if it is POSITIVE.  If you do not receive a call the day before your procedure you should begin your prep, if ordered, and you should report to the endo center for your procedure at your designated appointment arrival time ( one hour prior to the procedure time). There is no cost to you for the screening on the day of the swab.  [facility] will file with your insurance company for the testing.    You may receive an automated phone call prior to your procedure or have a message in your MyChart that you have an appointment for a BP/15 at the [facility], please disregard this message.  Your testing will be at the [place] [place] location.   If you are leaving [facility] travel [place] on [address], turn left onto [facility] [address], turn night onto [address] at the 1st stop light turn right, pass the [employer] on  your right and proceed to [place] (white building).    Thank you for coming today.  Silvano Rusk, MD, [facility]

## 2019-01-16 HISTORY — PX: COLONOSCOPY: SHX174

## 2019-01-24 ENCOUNTER — Other Ambulatory Visit: Payer: Self-pay | Admitting: Internal Medicine

## 2019-01-24 DIAGNOSIS — Z1159 Encounter for screening for other viral diseases: Secondary | ICD-10-CM | POA: Diagnosis not present

## 2019-01-25 LAB — SARS CORONAVIRUS 2 (TAT 6-24 HRS): SARS Coronavirus 2: NEGATIVE

## 2019-01-26 ENCOUNTER — Ambulatory Visit (AMBULATORY_SURGERY_CENTER): Payer: Medicare Other | Admitting: Internal Medicine

## 2019-01-26 ENCOUNTER — Encounter: Payer: Self-pay | Admitting: Internal Medicine

## 2019-01-26 ENCOUNTER — Other Ambulatory Visit: Payer: Self-pay

## 2019-01-26 VITALS — BP 123/72 | HR 67 | Temp 98.2°F | Resp 18 | Ht 70.0 in | Wt 210.0 lb

## 2019-01-26 DIAGNOSIS — D123 Benign neoplasm of transverse colon: Secondary | ICD-10-CM

## 2019-01-26 DIAGNOSIS — D122 Benign neoplasm of ascending colon: Secondary | ICD-10-CM

## 2019-01-26 DIAGNOSIS — D120 Benign neoplasm of cecum: Secondary | ICD-10-CM | POA: Diagnosis not present

## 2019-01-26 DIAGNOSIS — D129 Benign neoplasm of anus and anal canal: Secondary | ICD-10-CM

## 2019-01-26 DIAGNOSIS — Z1211 Encounter for screening for malignant neoplasm of colon: Secondary | ICD-10-CM

## 2019-01-26 DIAGNOSIS — D128 Benign neoplasm of rectum: Secondary | ICD-10-CM

## 2019-01-26 DIAGNOSIS — K635 Polyp of colon: Secondary | ICD-10-CM | POA: Diagnosis not present

## 2019-01-26 MED ORDER — SODIUM CHLORIDE 0.9 % IV SOLN: 500.0000 mL | INTRAVENOUS | Status: DC

## 2019-01-26 NOTE — Progress Notes (Signed)
Temp LC V/s CW 

## 2019-01-26 NOTE — Op Note (Signed)
[place] Patient Name: Steven Larsen Procedure Date: 01/26/2019 3:29 PM MRN: [id number] Endoscopist: Gatha Mayer , MD Age: 68 Referring MD:  Date of Birth: 1950-11-30 Gender: Male Account #: [id number] Procedure:                Colonoscopy Indications:              Screening for colorectal malignant neoplasm Medicines:                Propofol per Anesthesia, Monitored Anesthesia Care Procedure:                Pre-Anesthesia Assessment:                           - Prior to the procedure, a History and Physical                            was performed, and patient medications and                            allergies were reviewed. The patient's tolerance of                            previous anesthesia was also reviewed. The risks                            and benefits of the procedure and the sedation                            options and risks were discussed with the patient.                            All questions were answered, and informed consent                            was obtained. Prior Anticoagulants: The patient has                            taken no previous anticoagulant or antiplatelet                            agents. ASA Grade Assessment: II - A patient with                            mild systemic disease. After reviewing the risks                            and benefits, the patient was deemed in                            satisfactory condition to undergo the procedure.                           After obtaining informed consent, the colonoscope  was passed under direct vision. Throughout the                            procedure, the patient's blood pressure, pulse, and                            oxygen saturations were monitored continuously. The                            Colonoscope was introduced through the anus and                            advanced to the the cecum, identified by   appendiceal orifice and ileocecal valve. The                            colonoscopy was somewhat difficult due to                            significant looping. Successful completion of the                            procedure was aided by applying abdominal pressure.                            The patient tolerated the procedure well. The                            quality of the bowel preparation was good. The                            bowel preparation used was Miralax via split dose                            instruction. The ileocecal valve, appendiceal                            orifice, and rectum were photographed. Scope In: 3:38:44 PM Scope Out: 3:59:22 PM Scope Withdrawal Time: 0 hours 18 minutes 20 seconds  Total Procedure Duration: 0 hours 20 minutes 38 seconds  Findings:                 The perianal and digital rectal examinations were                            normal. Pertinent negatives include normal prostate                            (size, shape, and consistency).                           Five sessile polyps were found in the rectum,                            transverse colon, ascending colon and  cecum. The                            polyps were diminutive in size. These polyps were                            removed with a cold snare. Resection and retrieval                            were complete. Verification of patient                            identification for the specimen was done. Estimated                            blood loss was minimal.                           A few diverticula were found in the sigmoid colon.                           The exam was otherwise without abnormality on                            direct and retroflexion views. Complications:            No immediate complications. Estimated Blood Loss:     Estimated blood loss was minimal. Impression:               - Five diminutive polyps in the rectum, in the                             transverse colon, in the ascending colon and in the                            cecum, removed with a cold snare. Resected and                            retrieved.                           - Diverticulosis in the sigmoid colon.                           - The examination was otherwise normal on direct                            and retroflexion views. Recommendation:           - Patient has a contact number available for                            emergencies. The signs and symptoms of potential                            delayed complications were discussed with  the                            patient. Return to normal activities tomorrow.                            Written discharge instructions were provided to the                            patient.                           - Resume previous diet.                           - Continue present medications.                           - Repeat colonoscopy is recommended. The                            colonoscopy date will be determined after pathology                            results from today's exam become available for                            review. Gatha Mayer, MD 01/26/2019 4:11:26 PM This report has been signed electronically.

## 2019-01-26 NOTE — Progress Notes (Signed)
Called to room to assist during endoscopic procedure.  Patient ID and intended procedure confirmed with present staff. Received instructions for my participation in the procedure from the performing physician.  

## 2019-01-26 NOTE — Patient Instructions (Addendum)
I found and removed 5 little polyps. No signs of cancer. Prostate was normal.  I will let you know pathology results and when to have another routine colonoscopy by mail and/or My Chart.  I appreciate the opportunity to care for you. Gatha Mayer, MD, [facility]   HANDOUTS ON POLYPS & DIVERTICULOSIS GIVEN TO YOU TODAY  AWAIT PATHOLOGY RESULTS ON POLYPS REMOVED    YOU HAD AN ENDOSCOPIC PROCEDURE TODAY AT [facility]:   Refer to the procedure report that was given to you for any specific questions about what was found during the examination.  If the procedure report does not answer your questions, please call your gastroenterologist to clarify.  If you requested that your care partner not be given the details of your procedure findings, then the procedure report has been included in a sealed envelope for you to review at your convenience later.  YOU SHOULD EXPECT: Some feelings of bloating in the abdomen. Passage of more gas than usual.  Walking can help get rid of the air that was put into your GI tract during the procedure and reduce the bloating. If you had a lower endoscopy (such as a colonoscopy or flexible sigmoidoscopy) you may notice spotting of blood in your stool or on the toilet paper. If you underwent a bowel prep for your procedure, you may not have a normal bowel movement for a few days.  Please Note:  You might notice some irritation and congestion in your nose or some drainage.  This is from the oxygen used during your procedure.  There is no need for concern and it should clear up in a day or so.  SYMPTOMS TO REPORT IMMEDIATELY:   Following lower endoscopy (colonoscopy or flexible sigmoidoscopy):  Excessive amounts of blood in the stool  Significant tenderness or worsening of abdominal pains  Swelling of the abdomen that is new, acute  Fever of 100F or higher    For urgent or emergent issues, a gastroenterologist can be reached at any hour by calling  [phone].   DIET:  We do recommend a small meal at first, but then you may proceed to your regular diet.  Drink plenty of fluids but you should avoid alcoholic beverages for 24 hours.  ACTIVITY:  You should plan to take it easy for the rest of today and you should NOT DRIVE or use heavy machinery until tomorrow (because of the sedation medicines used during the test).    FOLLOW UP: Our staff will call the number listed on your records 48-72 hours following your procedure to check on you and address any questions or concerns that you may have regarding the information given to you following your procedure. If we do not reach you, we will leave a message.  We will attempt to reach you two times.  During this call, we will ask if you have developed any symptoms of COVID 19. If you develop any symptoms (ie: fever, flu-like symptoms, shortness of breath, cough etc.) before then, please call [phone].  If you test positive for Covid 19 in the 2 weeks post procedure, please call and report this information to [place].    If any biopsies were taken you will be contacted by phone or by letter within the next 1-3 weeks.  Please call us at [phone] if you have not heard about the biopsies in 3 weeks.    SIGNATURES/CONFIDENTIALITY: You and/or your care partner have signed paperwork which will be entered into  your electronic medical record.  These signatures attest to the fact that that the information above on your After Visit Summary has been reviewed and is understood.  Full responsibility of the confidentiality of this discharge information lies with you and/or your care-partner. 

## 2019-01-26 NOTE — Progress Notes (Signed)
A/ox3, pleased with MAC, report to RN 

## 2019-01-28 ENCOUNTER — Telehealth: Payer: Self-pay

## 2019-01-28 NOTE — Telephone Encounter (Signed)
  Follow up Call-  Call back number 01/26/2019  Post procedure Call Back phone  # [phone]  Permission to leave phone message No  Some recent data might be hidden     Patient questions:  Do you have a fever, pain , or abdominal swelling? No. Pain Score  0 *  Have you tolerated food without any problems? Yes.    Have you been able to return to your normal activities? Yes.    Do you have any questions about your discharge instructions: Diet   No. Medications  No. Follow up visit  No.  Do you have questions or concerns about your Care? No.  Actions: * If pain score is 4 or above: No action needed, pain <4.  1. Have you developed a fever since your procedure? no  2.   Have you had an respiratory symptoms (SOB or cough) since your procedure? no  3.   Have you tested positive for COVID 19 since your procedure no  4.   Have you had any family members/close contacts diagnosed with the COVID 19 since your procedure?  no   If yes to any of these questions please route to Joylene John, RN and Alphonsa Gin, Therapist, sports.

## 2019-02-08 ENCOUNTER — Encounter: Payer: Self-pay | Admitting: Internal Medicine

## 2019-02-08 NOTE — Progress Notes (Signed)
5 diminutive adenomas Recall 2023 My Chart letter

## 2019-03-20 ENCOUNTER — Other Ambulatory Visit: Payer: Self-pay | Admitting: Internal Medicine

## 2019-03-22 ENCOUNTER — Other Ambulatory Visit: Payer: Self-pay | Admitting: Internal Medicine

## 2019-12-04 IMAGING — DX DG CHEST 2V
2 series · 2 of 2 positions shown · non-contrast
Comparison: 10/12/2018

CLINICAL DATA: Follow-up pneumonia.  Asymptomatic.

EXAM:
CHEST - 2 VIEW

[chest pa]
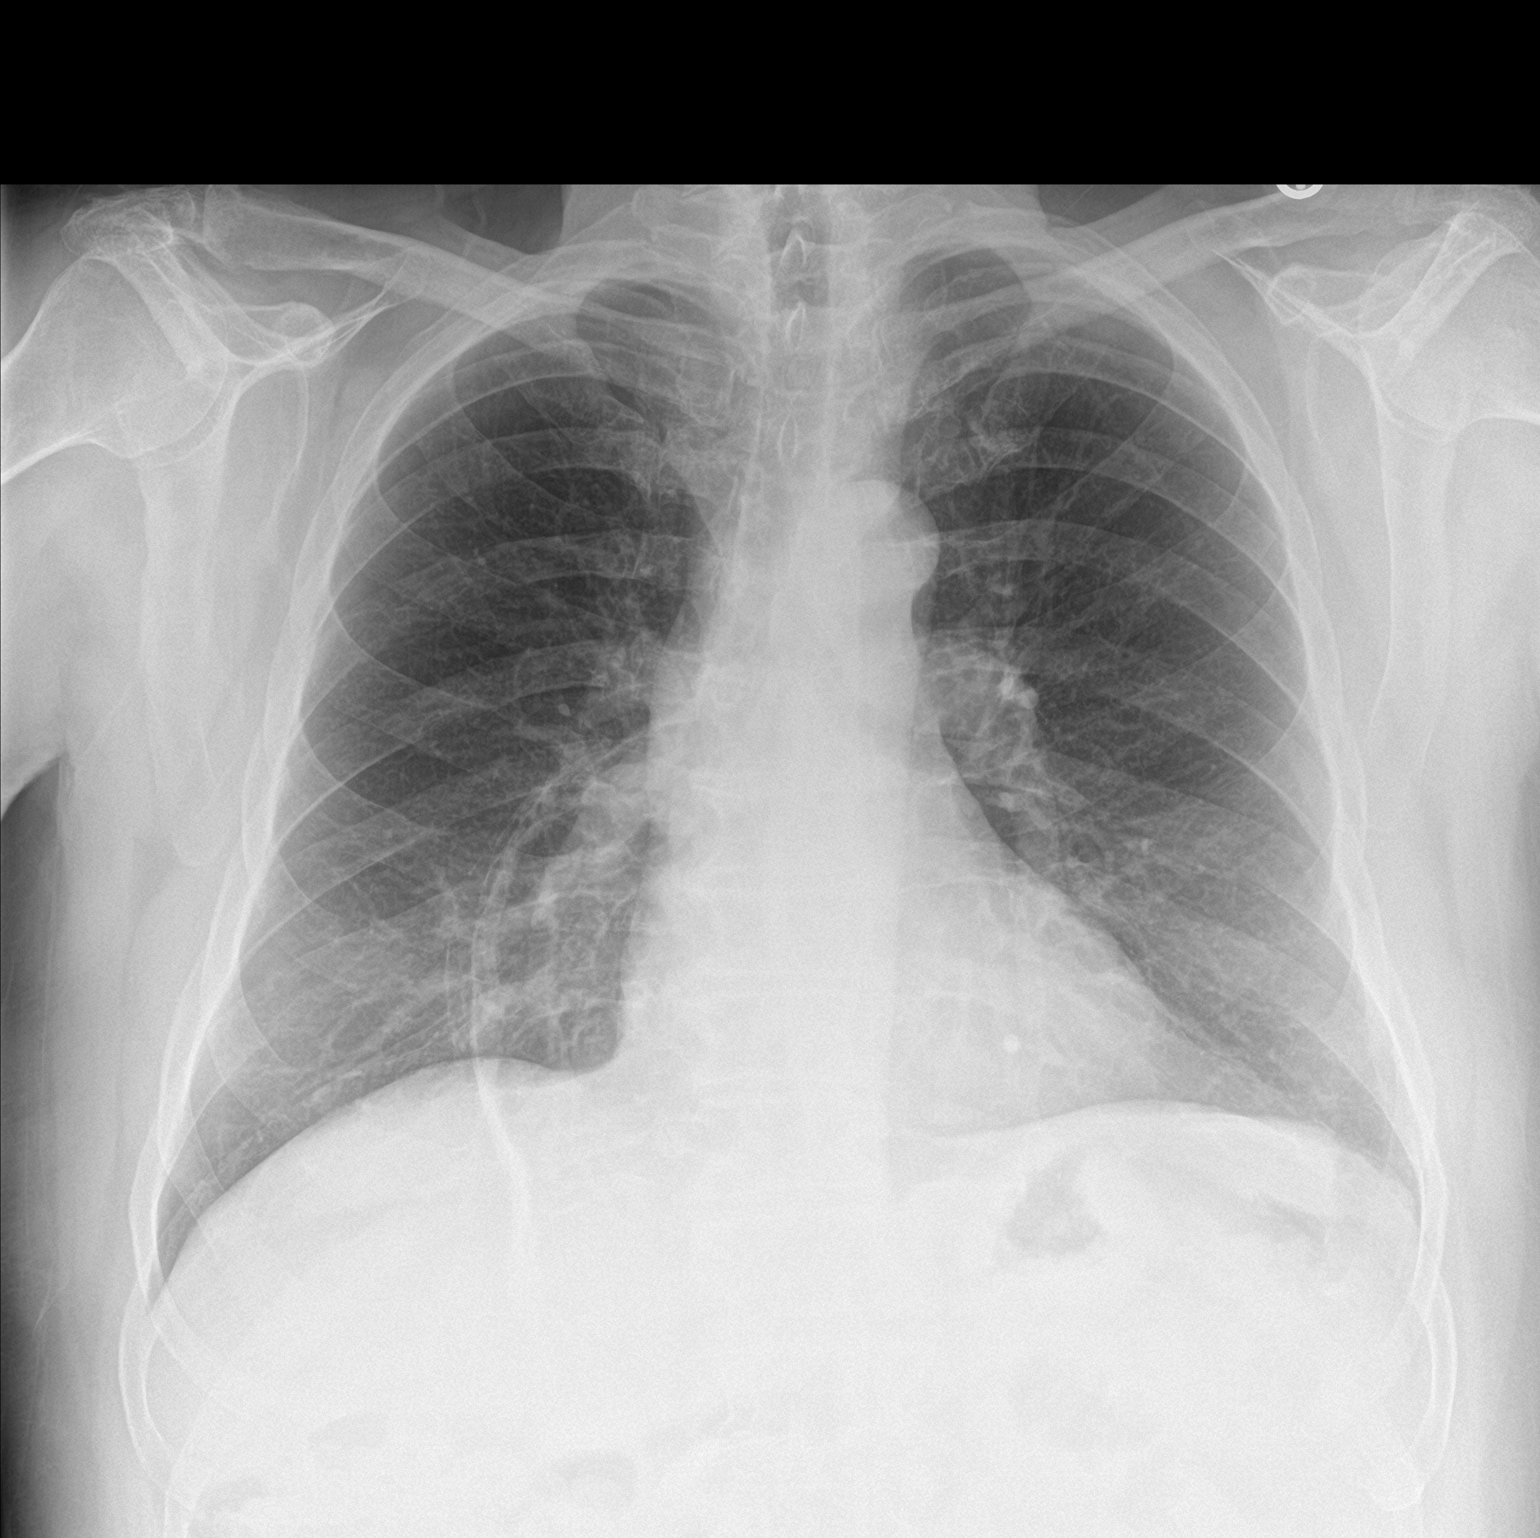

[chest lat]
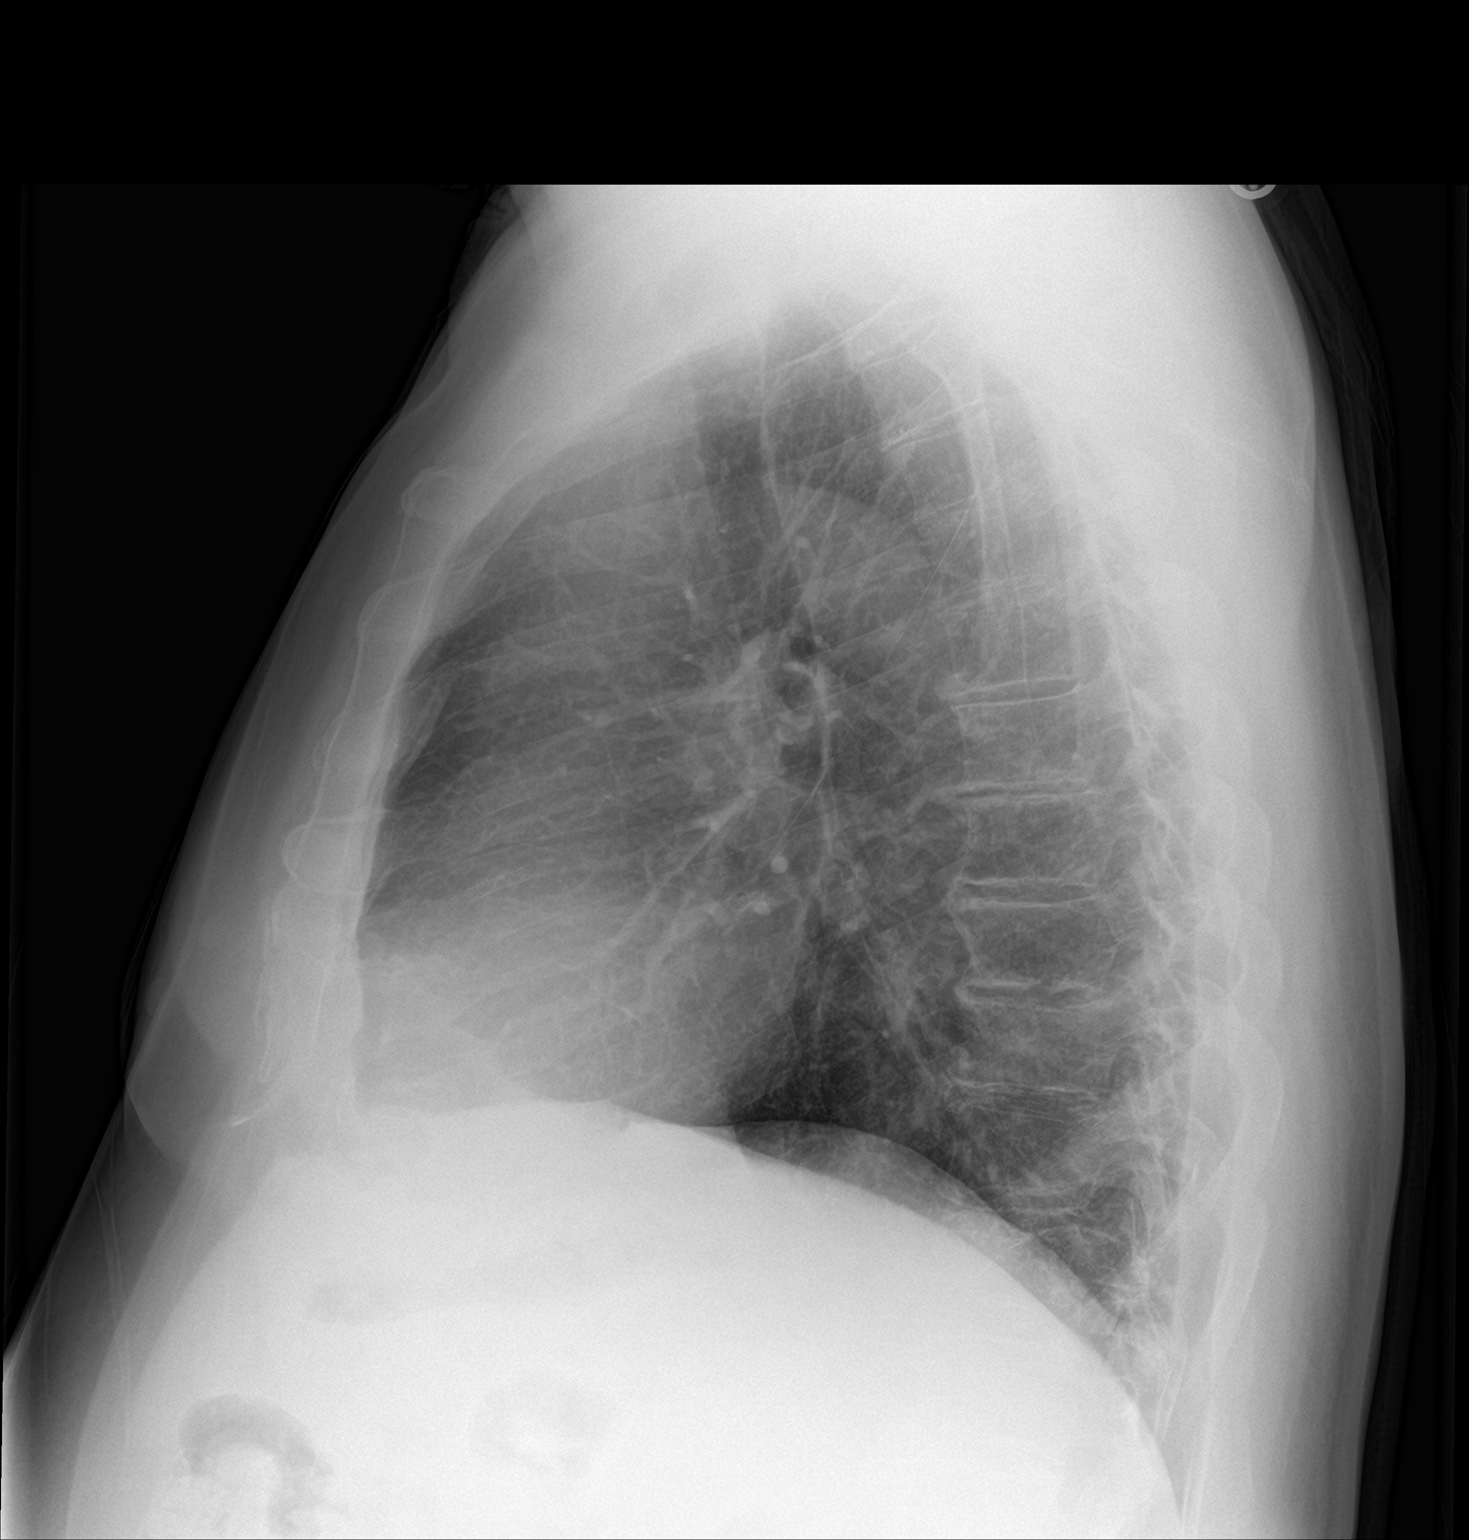

[2 of 2 positions shown; findings below may reference images not displayed]

FINDINGS: Lungs are adequately inflated without consolidation or effusion.
Cardiomediastinal silhouette and remainder the exam is unchanged.
IMPRESSION: No active cardiopulmonary disease.

## 2020-01-04 ENCOUNTER — Ambulatory Visit (INDEPENDENT_AMBULATORY_CARE_PROVIDER_SITE_OTHER): Payer: Medicare Other | Admitting: Internal Medicine

## 2020-01-04 ENCOUNTER — Encounter: Payer: Self-pay | Admitting: Internal Medicine

## 2020-01-04 VITALS — BP 126/76 | HR 65 | Ht 70.0 in | Wt 228.0 lb

## 2020-01-04 DIAGNOSIS — E7800 Pure hypercholesterolemia, unspecified: Secondary | ICD-10-CM

## 2020-01-04 DIAGNOSIS — I2510 Atherosclerotic heart disease of native coronary artery without angina pectoris: Secondary | ICD-10-CM

## 2020-01-04 DIAGNOSIS — I10 Essential (primary) hypertension: Secondary | ICD-10-CM | POA: Diagnosis not present

## 2020-01-04 NOTE — Patient Instructions (Signed)
Medication Instructions:  Your physician recommends that you continue on your current medications as directed. Please refer to the Current Medication list given to you today.  *If you need a refill on your cardiac medications before your next appointment, please call your pharmacy*   Lab Work: FASTING lab work to check cholesterol  If you have labs (blood work) drawn today and your tests are completely normal, you will receive your results only by: . MyChart Message (if you have MyChart) OR . A paper copy in the mail If you have any lab test that is abnormal or we need to change your treatment, we will call you to review the results.   Testing/Procedures: NONE   Follow-Up: At [facility], you and your health needs are our priority.  As part of our continuing mission to provide you with exceptional heart care, we have created designated Provider Care Teams.  These Care Teams include your primary Cardiologist (physician) and Advanced Practice Providers (APPs -  Physician Assistants and Nurse Practitioners) who all work together to provide you with the care you need, when you need it.  We recommend signing up for the patient portal called "MyChart".  Sign up information is provided on this After Visit Summary.  MyChart is used to connect with patients for Virtual Visits (Telemedicine).  Patients are able to view lab/test results, encounter notes, upcoming appointments, etc.  Non-urgent messages can be sent to your provider as well.   To learn more about what you can do with MyChart, go to [email or web address].    Your next appointment:   12 month(s)  The format for your next appointment:   In Person  Provider:   You may see Kenneth C Hilty, MD or one of the following Advanced Practice Providers on your designated Care Team:    Hao Meng, PA-C  Angela Duke, PA-C or   Krista Kroeger, PA-C    Other Instructions   

## 2020-01-04 NOTE — Progress Notes (Signed)
OFFICE CONSULT NOTE  Chief Complaint:  Follow-up  Primary Care Physician: Darreld Mclean, MD  HPI:  Steven Larsen is a 69 y.o. male who is being seen today for the evaluation of exertional dyspnea, chest discomfort at the request of Dr. Carlean Purl. 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  Recently he was push mowing the yard and noted onset of chest tightness and burning which resolved at rest.  The symptoms then came back when he started working again.  This happened several times and then eventually stopped.  There was originally concern for possible reflux and he was seen by Dr. Arelia Longest with [facility].  Because of the exertional nature of his symptoms, he was concerned that this may represent unstable angina.  Steven Larsen, has not had any routine medical care over the past several years.  He does not currently take medications or carry any outstanding diagnoses.  Family history is not significant for early onset heart disease, mostly notable for cancer in his father.  He reports taking some reflux medications without any benefit.  Blood pressure today noted to be elevated in the upper 140s over 90s.  He is not known to have hypertension.  03/02/2018  Steven Larsen returns today for follow-up of heart cath.  When I saw him in the office he was complaining of symptoms concerning for unstable angina and he was referred directly for heart catheterization.  This was performed on 11/18/2017, and demonstrated single-vessel coronary disease with a 95% eccentric mid LAD stenosis after a second diagonal vessel.  LVEF was normal at 55%.  He underwent PCI to the LAD with excellent result.  Ultimately was switched to aspirin and Brilinta.  Since then on follow-up he has been doing well.  His blood sugar was noted to be elevated and his cholesterol has  significantly improved to LDL of 55 on high potency statin.  He denies any shortness of breath or recurrent chest pain.  He is managed to lose about 20 pounds with significant dietary changes.  01/04/2020  Steven Larsen is seen today in follow-up.  Overall he continues to do well.  In the interim he was seen by Roby Lofts, PA-C who discontinued his Brilinta.  He remains on aspirin, statin and metoprolol.  He denies any chest pain or worsening shortness of breath.  Blood pressure is very well controlled.  Lipids last year were well controlled with an LDL of 60.  He is due for repeat lipids.  EKG today shows normal sinus rhythm.  He says he is also managed to lose several pounds.  PMHx:  Past Medical History:  Diagnosis Date  . CAD (coronary artery disease)    11/18/17 PCI/DES mLAD, normal EF  . High cholesterol   . History of kidney stones   . Hypertension   . Internal hemorrhoid     Past Surgical History:  Procedure Laterality Date  . LEFT HEART CATH AND CORONARY ANGIOGRAPHY N/A 11/18/2017   Procedure: LEFT HEART CATH AND CORONARY ANGIOGRAPHY;  Surgeon: Troy Sine, MD;  Location: [facility];  Service: Cardiovascular;  Laterality: N/A;    FAMHx:  Family History  Problem Relation Age of Onset  . Prostate cancer Father   . Colon cancer Father     SOCHx:   reports that he has never smoked.  He has never used smokeless tobacco. He reports that he does not drink alcohol and does not use drugs.  ALLERGIES:  No Known Allergies  ROS: Pertinent items noted in HPI and remainder of comprehensive ROS otherwise negative.  HOME MEDS: Current Outpatient Medications on File Prior to Visit  Medication Sig Dispense Refill  . aspirin EC 81 MG tablet Take 81 mg by mouth daily.     Marland Kitchen atorvastatin (LIPITOR) 80 MG tablet TAKE 1 TABLET (80 MG TOTAL) BY MOUTH DAILY AT 6 PM. *PLEASE CALL TO SCHEDULE AN APPOINTMENT* 90 tablet 0  . metoprolol succinate (TOPROL-XL) 50 MG 24 hr tablet Take 1  tablet (50 mg total) by mouth daily. 90 tablet 3  . nitroGLYCERIN (NITROSTAT) 0.4 MG SL tablet Place 1 tablet (0.4 mg total) under the tongue every 5 (five) minutes as needed. 25 tablet 0  . OVER THE COUNTER MEDICATION Take 1 capsule by mouth daily. Super Beta Prostate     . acetaminophen (TYLENOL) 500 MG tablet Take 500 mg by mouth as needed.     No current facility-administered medications on file prior to visit.    LABS/IMAGING: No results found for this or any previous visit (from the past 48 hour(s)). No results found.  LIPID PANEL:    Component Value Date/Time   CHOL 127 11/25/2018 1511   CHOL 128 12/29/2017 1607   TRIG 102.0 11/25/2018 1511   HDL 47.40 11/25/2018 1511   HDL 46 12/29/2017 1607   CHOLHDL 3 11/25/2018 1511   VLDL 20.4 11/25/2018 1511   LDLCALC 60 11/25/2018 1511   LDLCALC 55 12/29/2017 1607    WEIGHTS: Wt Readings from Last 3 Encounters:  01/04/20 228 lb (103.4 kg)  01/26/19 210 lb (95.3 kg)  01/04/19 210 lb (95.3 kg)    VITALS: BP 126/76   Pulse 65   Ht 5\' 10"  (1.778 m)   Wt 228 lb (103.4 kg) Comment: Pt had steel toe boats on  SpO2 95%   BMI 32.71 kg/m   EXAM: General appearance: alert and no distress Neck: no carotid bruit, no JVD and thyroid not enlarged, symmetric, no tenderness/mass/nodules Lungs: clear to auscultation bilaterally Heart: regular rate and rhythm, S1, S2 normal, no murmur, click, rub or gallop Abdomen: soft, non-tender; bowel sounds normal; no masses,  no organomegaly and obese Extremities: extremities normal, atraumatic, no cyanosis or edema Pulses: 2+ and symmetric Skin: Skin color, texture, turgor normal. No rashes or lesions Neurologic: Grossly normal Psych: Pleasant  EKG: Sinus rhythm at 65, possible left atrial margin-personally reviewed  ASSESSMENT: 1. Coronary artery disease status post PCI to the mid LAD (11/2017) 2. Dyslipidemia 3. Hypertension  PLAN: 1.   Steven Larsen continues to do well after PCI to  the LAD in 2019.  Cholesterol is very well controlled and is due to be repeated.  Blood pressure is at goal.  He remains on aspirin, statin and beta-blocker.  He is physically active and recently has lost some weight.  He is trying to continue to make dietary changes.  Overall he is doing well I will plan to see him back annually or sooner as necessary.  Pixie Casino, MD, [facility], [place] Director of the Advanced Lipid Disorders &  Cardiovascular Risk Reduction Clinic Diplomate of the American Board of Clinical Lipidology Attending Cardiologist  Direct Dial: [phone]  Fax: [phone]  Website:  [email or web address] 01/04/2020, 3:18 PM

## 2020-03-18 ENCOUNTER — Other Ambulatory Visit: Payer: Self-pay | Admitting: Internal Medicine

## 2020-04-10 ENCOUNTER — Other Ambulatory Visit: Payer: Self-pay | Admitting: Internal Medicine

## 2020-08-25 ENCOUNTER — Other Ambulatory Visit: Payer: Self-pay | Admitting: Internal Medicine

## 2020-11-26 ENCOUNTER — Other Ambulatory Visit: Payer: Self-pay | Admitting: Internal Medicine

## 2021-02-23 ENCOUNTER — Other Ambulatory Visit: Payer: Self-pay | Admitting: Internal Medicine

## 2021-04-15 ENCOUNTER — Other Ambulatory Visit: Payer: Self-pay | Admitting: Internal Medicine

## 2021-05-15 ENCOUNTER — Other Ambulatory Visit: Payer: Self-pay | Admitting: Internal Medicine

## 2021-05-30 DIAGNOSIS — Z20822 Contact with and (suspected) exposure to covid-19: Secondary | ICD-10-CM | POA: Diagnosis not present

## 2021-06-01 ENCOUNTER — Other Ambulatory Visit: Payer: Self-pay | Admitting: Internal Medicine

## 2021-07-03 ENCOUNTER — Other Ambulatory Visit: Payer: Self-pay | Admitting: Internal Medicine

## 2021-07-08 DIAGNOSIS — Z20822 Contact with and (suspected) exposure to covid-19: Secondary | ICD-10-CM | POA: Diagnosis not present

## 2021-07-18 DIAGNOSIS — Z20822 Contact with and (suspected) exposure to covid-19: Secondary | ICD-10-CM | POA: Diagnosis not present

## 2021-10-04 ENCOUNTER — Other Ambulatory Visit: Payer: Self-pay | Admitting: Internal Medicine

## 2021-10-16 ENCOUNTER — Telehealth: Payer: Self-pay | Admitting: Internal Medicine

## 2021-10-16 NOTE — Telephone Encounter (Signed)
*  STAT* If patient is at the pharmacy, call can be transferred to refill team.   1. Which medications need to be refilled? (please list name of each medication and dose if known) metoprolol succinate (TOPROL-XL) 50 MG 24 hr tablet  2. Which pharmacy/location (including street and city if local pharmacy) is medication to be sent to? [place], [place] - [address] AT [place]  3. Do they need a 30 day or 90 day supply? 90 day

## 2021-10-18 ENCOUNTER — Other Ambulatory Visit: Payer: Self-pay | Admitting: Internal Medicine

## 2021-11-06 ENCOUNTER — Telehealth: Payer: Self-pay | Admitting: Internal Medicine

## 2021-11-06 ENCOUNTER — Other Ambulatory Visit: Payer: Self-pay | Admitting: Internal Medicine

## 2021-11-06 MED ORDER — METOPROLOL SUCCINATE ER 50 MG PO TB24: ORAL_TABLET | ORAL | 0 refills | Status: DC

## 2021-11-06 NOTE — Telephone Encounter (Signed)
*  STAT* If patient is at the pharmacy, call can be transferred to refill team.   1. Which medications need to be refilled? (please list name of each medication and dose if known)   metoprolol succinate (TOPROL-XL) 50 MG 24 hr tablet  2. Which pharmacy/location (including street and city if local pharmacy) is medication to be sent to?  [id number]- [place], [place] - [address]  3. Do they need a 30 day or 90 day supply?  90 days  Wife called stating patient is completely out of this medication.   Patient has appointment scheduled on 11/20/21.

## 2021-11-20 ENCOUNTER — Ambulatory Visit: Payer: Medicare Other | Attending: Physician Assistant | Admitting: Physician Assistant

## 2021-11-20 ENCOUNTER — Encounter: Payer: Self-pay | Admitting: Physician Assistant

## 2021-11-20 VITALS — BP 134/70 | HR 63 | Ht 70.5 in | Wt 208.0 lb

## 2021-11-20 DIAGNOSIS — I4510 Unspecified right bundle-branch block: Secondary | ICD-10-CM

## 2021-11-20 DIAGNOSIS — E785 Hyperlipidemia, unspecified: Secondary | ICD-10-CM | POA: Diagnosis not present

## 2021-11-20 DIAGNOSIS — R9431 Abnormal electrocardiogram [ECG] [EKG]: Secondary | ICD-10-CM | POA: Diagnosis not present

## 2021-11-20 DIAGNOSIS — I2510 Atherosclerotic heart disease of native coronary artery without angina pectoris: Secondary | ICD-10-CM

## 2021-11-20 DIAGNOSIS — I10 Essential (primary) hypertension: Secondary | ICD-10-CM

## 2021-11-20 MED ORDER — NITROGLYCERIN 0.4 MG SL SUBL: 0.4000 mg | SUBLINGUAL_TABLET | SUBLINGUAL | 2 refills | Status: AC | PRN

## 2021-11-20 MED ORDER — METOPROLOL SUCCINATE ER 50 MG PO TB24: 50.0000 mg | ORAL_TABLET | Freq: Every day | ORAL | 3 refills | Status: DC

## 2021-11-20 MED ORDER — ATORVASTATIN CALCIUM 80 MG PO TABS: 80.0000 mg | ORAL_TABLET | Freq: Every day | ORAL | 3 refills | Status: DC

## 2021-11-20 NOTE — Patient Instructions (Signed)
Medication Instructions:  Your physician recommends that you continue on your current medications as directed. Please refer to the Current Medication list given to you today.  *If you need a refill on your cardiac medications before your next appointment, please call your pharmacy*  Lab Work: Your physician recommends that you return for lab work TODAY:  CBC CMP Fasting Lipid Panel-DO NOT EAT or DRINK past midnight. Okay to have water and/or black coffee only  If you have labs (blood work) drawn today and your tests are completely normal, you will receive your results only by: [place] (if you have MyChart) OR A paper copy in the mail If you have any lab test that is abnormal or we need to change your treatment, we will call you to review the results.  Testing/Procedures: Your physician has requested that you have an echocardiogram. Echocardiography is a painless test that uses sound waves to create images of your heart. It provides your doctor with information about the size and shape of your heart and how well your heart's chambers and valves are working. This procedure takes approximately one hour. There are no restrictions for this procedure.  Follow-Up: At [facility], you and your health needs are our priority.  As part of our continuing mission to provide you with exceptional heart care, we have created designated Provider Care Teams.  These Care Teams include your primary Cardiologist (physician) and Advanced Practice Providers (APPs -  Physician Assistants and Nurse Practitioners) who all work together to provide you with the care you need, when you need it.  Your next appointment:   1 year(s)  The format for your next appointment:   In Person  Provider:   Pixie Casino, MD     Other Instructions  Important Information About Sugar

## 2021-11-20 NOTE — Progress Notes (Signed)
Cardiology Office Note:    Date:  11/21/2021   ID:  [name], DOB 02-03-1951, MRN [id number]  PCP:  Steven Mclean, MD   [place] Prov[facility] Cardiologist:  Pixie Casino, MD     Referring MD: Steven Mclean, MD   Chief Complaint  Patient presents with   Follow-up    Seen for Dr. Debara Pickett    History of Present Illness:    Steven Larsen is a 71 y.o. male with a hx of hypertension, hyperlipidemia, and CAD.  Due to exertional chest discomfort, patient underwent cardiac catheterization on 11/18/2017, this revealed single-vessel disease with 95% proximal to mid LAD, 30% ostial LAD, EF 55%, LVEDP 13 mmHg, the proximal LAD lesion was treated with Resolute DES.  Post cath, patient was placed on aspirin and Brilinta.  Brilinta has since been discontinued.  Patient was last seen by Dr. Debara Pickett in October 2021 at which time he was doing well and managed to lose significant amount of weight.  Patient presents today for follow-up.  He denies any recent chest pain or worsening dyspnea.  Previous anginal symptom was throat pain with exertion.  He has not had any symptoms recently that is reminiscent of the previous angina.  He remains active operating [place].  He has no significant lower extremity edema.  EKG shows new right bundle branch block that is unchanged when compared to 2021.  I recommended a baseline echocardiogram, if ejection fraction is normal, I would not recommend any further work-up.  He is also overdue for baseline blood work including CMP, CBC and a fasting lipid panel.  He is not fasting today, therefore the lab work will have to be done another day.  Otherwise he can follow-up in 1 year.  Past Medical History:  Diagnosis Date   CAD (coronary artery disease)    11/18/17 PCI/DES mLAD, normal EF   High cholesterol    History of kidney stones    Hypertension    Internal hemorrhoid     Past Surgical History:  Procedure Laterality Date   LEFT HEART  CATH AND CORONARY ANGIOGRAPHY N/A 11/18/2017   Procedure: LEFT HEART CATH AND CORONARY ANGIOGRAPHY;  Surgeon: Troy Sine, MD;  Location: [facility];  Service: Cardiovascular;  Laterality: N/A;    Current Medications: Current Meds  Medication Sig   acetaminophen (TYLENOL) 500 MG tablet Take 500 mg by mouth as needed.   aspirin EC 81 MG tablet Take 81 mg by mouth daily.    OVER THE COUNTER MEDICATION Take 1 capsule by mouth daily. Super Beta Prostate    [DISCONTINUED] atorvastatin (LIPITOR) 80 MG tablet TAKE 1 TABLET BY MOUTH DAILY   [DISCONTINUED] metoprolol succinate (TOPROL-XL) 50 MG 24 hr tablet TAKE 1 TABLET (50 MG) BY MOUTH DAILY. MUST SCHEDULE OFFICE VISIT FOR FUTURE REFILLS. LAST ATTEMPT   [DISCONTINUED] nitroGLYCERIN (NITROSTAT) 0.4 MG SL tablet Place 1 tablet (0.4 mg total) under the tongue every 5 (five) minutes as needed.     Allergies:   Patient has no known allergies.   Social History   Socioeconomic History   Marital status: Married    Spouse name: Steven Larsen   Number of children: 4   Years of education: Not on file   Highest education level: Not on file  Occupational History    Employer: [employer]    Comment: Owner  Tobacco Use   Smoking status: Never   Smokeless tobacco: Never  Vaping Use  Vaping Use: Never used  Substance and Sexual Activity   Alcohol use: Never   Drug use: Never   Sexual activity: Yes    Partners: Female  Other Topics Concern   Not on file  Social History Narrative   Married second time, 1 son and 3 daughters biologic and stepchildren   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  ROS:   Please see the history of present illness.     All other systems reviewed and are negative.  EKGs/Labs/Other Studies Reviewed:    The following studies were reviewed today:  Cath 11/18/2017 Prox LAD to Mid LAD lesion is 95% stenosed. Ost LAD to Prox LAD lesion is 30% stenosed. Prox LAD-1 lesion is 20% stenosed. Prox LAD-2 lesion is 30% stenosed. A stent was successfully placed. Post intervention, there is a 0% residual stenosis. Post intervention, there is a 0% residual stenosis. The left ventricular systolic function is normal. LV end diastolic pressure is normal.   Single-vessel coronary obstructive disease in a dominant left coronary system.  There is 30% smooth proximal LAD stenosis before the takeoff of the first diagonal vessel.  There is a 20% stenosis immediately after the first small diagonal vessel.  In the mid LAD there was 30% proximal and 95% eccentric stenosis immediately after the second diagonal vessel.  The LAD was large caliber wrapped around the apex.   The ramus intermediate, dominant left circumflex vessel, and nondominant RCA vessels were normal.   Global LV function with an ejection fraction at 55%.  LVEDP 13 mm.  There were no focal segmental wall motion abnormalities.   RECOMMENDATION: The patient will be started on dual antiplatelet therapy with aspirin and Brilinta.  High potency statin therapy will be initiated.  The patient will also be started on beta-blocker therapy.  Optimal blood pressure with target less than 130/80 will be necessary.   Recommend uninterrupted dual antiplatelet therapy with Aspirin '81mg'$  daily and Ticagrelor '90mg'$  twice daily for a minimum of 6 months (stable ischemic heart disease - Class I recommendation) but if tolerated would continue for 1 year.  EKG:  EKG is ordered today.  The ekg ordered today demonstrates normal sinus rhythm, right bundle branch  block.  Recent Labs: 11/21/2021: ALT WILL FOLLOW; BUN WILL FOLLOW; Creatinine, Ser WILL FOLLOW; Hemoglobin 15.0; Platelets 200; Potassium WILL FOLLOW; Sodium WILL FOLLOW  Recent Lipid Panel    Component Value Date/Time   CHOL WILL FOLLOW 11/21/2021 1517   TRIG WILL FOLLOW 11/21/2021 1517   HDL WILL FOLLOW 11/21/2021 1517   CHOLHDL WILL FOLLOW 11/21/2021 1517   CHOLHDL 3 11/25/2018 1511   VLDL 20.4 11/25/2018 1511   LDLCALC WILL FOLLOW 11/21/2021 1517     Risk Assessment/Calculations:           Physical Exam:    VS:  BP 134/70   Pulse 63   Ht 5' 10.5" (1.791 m)   Wt 208 lb (94.3 kg)   SpO2 97%   BMI 29.42 kg/m         Wt  Readings from Last 3 Encounters:  11/20/21 208 lb (94.3 kg)  01/04/20 228 lb (103.4 kg)  01/26/19 210 lb (95.3 kg)     GEN:  Well nourished, well developed in no acute distress HEENT: Normal NECK: No JVD; No carotid bruits LYMPHATICS: No lymphadenopathy CARDIAC: RRR, no murmurs, rubs, gallops RESPIRATORY:  Clear to auscultation without rales, wheezing or rhonchi  ABDOMEN: Soft, non-tender, non-distended MUSCULOSKELETAL:  No edema; No deformity  SKIN: Warm and dry NEUROLOGIC:  Alert and oriented x 3 PSYCHIATRIC:  Normal affect   ASSESSMENT:    1. Coronary artery disease involving native coronary artery of native heart without angina pectoris   2. Hyperlipidemia LDL goal <70   3. Abnormal EKG   4. RBBB (right bundle branch block)   5. Essential hypertension    PLAN:    In order of problems listed above:  CAD: Denies any chest pain.  Last PCI was in 2019.  Previous anginal symptom was throat pain with exertion.  He has not had any anginal equivalent symptoms.  Continue aspirin, Lipitor and metoprolol  Hyperlipidemia: On Lipitor.  Overdue for blood work.  Obtain CMP, CBC and a fasting lipid panel.  Right bundle branch block: This appears to be new when compared to the previous EKG.  I recommended a baseline echocardiogram.  I reassured the  patient that right bundle branch block is often benign  Hypertension: Blood pressure well controlled.           Medication Adjustments/Labs and Tests Ordered: Current medicines are reviewed at length with the patient today.  Concerns regarding medicines are outlined above.  Orders Placed This Encounter  Procedures   CBC   Comprehensive metabolic panel   Lipid panel   EKG 12-Lead   ECHOCARDIOGRAM COMPLETE   Meds ordered this encounter  Medications   atorvastatin (LIPITOR) 80 MG tablet    Sig: Take 1 tablet (80 mg total) by mouth daily.    Dispense:  90 tablet    Refill:  3    **Patient requests 90 days supply**   metoprolol succinate (TOPROL-XL) 50 MG 24 hr tablet    Sig: Take 1 tablet (50 mg total) by mouth daily.    Dispense:  90 tablet    Refill:  3   nitroGLYCERIN (NITROSTAT) 0.4 MG SL tablet    Sig: Place 1 tablet (0.4 mg total) under the tongue every 5 (five) minutes as needed.    Dispense:  25 tablet    Refill:  2    Patient Instructions  Medication Instructions:  Your physician recommends that you continue on your current medications as directed. Please refer to the Current Medication list given to you today.  *If you need a refill on your cardiac medications before your next appointment, please call your pharmacy*  Lab Work: Your physician recommends that you return for lab work TODAY:  CBC CMP Fasting Lipid Panel-DO NOT EAT or DRINK past midnight. Okay to have water and/or black coffee only  If you have labs (blood work) drawn today and your tests are completely normal, you will receive your results only by: [place] (if you have MyChart) OR A paper copy in the mail If you have any lab test that is abnormal or we need to change your treatment, we will call you to review the results.  Testing/Procedures: Your physician has requested that you have an echocardiogram. Echocardiography is a painless test that uses sound waves to create images of your  heart. It provides  your doctor with information about the size and shape of your heart and how well your heart's chambers and valves are working. This procedure takes approximately one hour. There are no restrictions for this procedure.  Follow-Up: At [facility], you and your health needs are our priority.  As part of our continuing mission to provide you with exceptional heart care, we have created designated Provider Care Teams.  These Care Teams include your primary Cardiologist (physician) and Advanced Practice Providers (APPs -  Physician Assistants and Nurse Practitioners) who all work together to provide you with the care you need, when you need it.  Your next appointment:   1 year(s)  The format for your next appointment:   In Person  Provider:   Pixie Casino, MD     Other Instructions  Important Information About Sugar         Hilbert Corrigan, [place]  11/21/2021 [place]

## 2021-11-21 ENCOUNTER — Encounter: Payer: Self-pay | Admitting: Physician Assistant

## 2021-11-21 DIAGNOSIS — E785 Hyperlipidemia, unspecified: Secondary | ICD-10-CM | POA: Diagnosis not present

## 2021-11-22 LAB — COMPREHENSIVE METABOLIC PANEL
ALT: 26 IU/L (ref 0–44)
AST: 21 IU/L (ref 0–40)
Albumin/Globulin Ratio: 2.1 (ref 1.2–2.2)
Albumin: 4.5 g/dL (ref 3.9–4.9)
Alkaline Phosphatase: 80 IU/L (ref 44–121)
BUN/Creatinine Ratio: 23 (ref 10–24)
BUN: 22 mg/dL (ref 8–27)
Bilirubin Total: 1.2 mg/dL (ref 0.0–1.2)
CO2: 24 mmol/L (ref 20–29)
Calcium: 9.5 mg/dL (ref 8.6–10.2)
Chloride: 106 mmol/L (ref 96–106)
Creatinine, Ser: 0.94 mg/dL (ref 0.76–1.27)
Globulin, Total: 2.1 g/dL (ref 1.5–4.5)
Glucose: 132 mg/dL — ABNORMAL HIGH (ref 70–99)
Potassium: 5 mmol/L (ref 3.5–5.2)
Sodium: 144 mmol/L (ref 134–144)
Total Protein: 6.6 g/dL (ref 6.0–8.5)
eGFR: 88 mL/min/{1.73_m2} (ref >59)

## 2021-11-22 LAB — LIPID PANEL
Chol/HDL Ratio: 2.5 ratio (ref 0.0–5.0)
Cholesterol, Total: 117 mg/dL (ref 100–199)
HDL: 46 mg/dL (ref >39)
LDL Chol Calc (NIH): 57 mg/dL (ref 0–99)
Triglycerides: 64 mg/dL (ref 0–149)
VLDL Cholesterol Cal: 14 mg/dL (ref 5–40)

## 2021-11-22 LAB — CBC
Hematocrit: 44.5 % (ref 37.5–51.0)
Hemoglobin: 15 g/dL (ref 13.0–17.7)
MCH: 32.3 pg (ref 26.6–33.0)
MCHC: 33.7 g/dL (ref 31.5–35.7)
MCV: 96 fL (ref 79–97)
Platelets: 200 10*3/uL (ref 150–450)
RBC: 4.65 x10E6/uL (ref 4.14–5.80)
RDW: 12 % (ref 11.6–15.4)
WBC: 6.8 10*3/uL (ref 3.4–10.8)

## 2021-12-04 ENCOUNTER — Ambulatory Visit (HOSPITAL_COMMUNITY): Payer: Medicare Other | Attending: Physician Assistant

## 2021-12-04 DIAGNOSIS — I4510 Unspecified right bundle-branch block: Secondary | ICD-10-CM | POA: Insufficient documentation

## 2021-12-04 DIAGNOSIS — R9431 Abnormal electrocardiogram [ECG] [EKG]: Secondary | ICD-10-CM | POA: Insufficient documentation

## 2021-12-04 LAB — ECHOCARDIOGRAM COMPLETE
Area-P 1/2: 3.72 cm2
S' Lateral: 2.8 cm

## 2021-12-11 ENCOUNTER — Other Ambulatory Visit: Payer: Self-pay

## 2021-12-11 DIAGNOSIS — R9431 Abnormal electrocardiogram [ECG] [EKG]: Secondary | ICD-10-CM

## 2021-12-11 DIAGNOSIS — I4510 Unspecified right bundle-branch block: Secondary | ICD-10-CM

## 2022-03-12 ENCOUNTER — Encounter: Payer: Self-pay | Admitting: Internal Medicine

## 2022-03-21 ENCOUNTER — Encounter: Payer: Self-pay | Admitting: Internal Medicine

## 2022-04-09 ENCOUNTER — Ambulatory Visit (AMBULATORY_SURGERY_CENTER): Payer: Medicare Other | Admitting: *Deleted

## 2022-04-09 VITALS — Ht 70.5 in | Wt 218.0 lb

## 2022-04-09 DIAGNOSIS — Z800 Family history of malignant neoplasm of digestive organs: Secondary | ICD-10-CM

## 2022-04-09 DIAGNOSIS — Z860100 Personal history of colon polyps, unspecified: Secondary | ICD-10-CM

## 2022-04-09 NOTE — Progress Notes (Signed)

## 2022-04-23 ENCOUNTER — Encounter: Payer: Self-pay | Admitting: Internal Medicine

## 2022-04-23 ENCOUNTER — Ambulatory Visit (AMBULATORY_SURGERY_CENTER): Payer: Medicare Other | Admitting: Internal Medicine

## 2022-04-23 ENCOUNTER — Telehealth: Payer: Self-pay | Admitting: Internal Medicine

## 2022-04-23 VITALS — BP 118/63 | HR 61 | Temp 96.8°F | Resp 14 | Ht 70.0 in | Wt 211.0 lb

## 2022-04-23 DIAGNOSIS — I2510 Atherosclerotic heart disease of native coronary artery without angina pectoris: Secondary | ICD-10-CM | POA: Diagnosis not present

## 2022-04-23 DIAGNOSIS — K5730 Diverticulosis of large intestine without perforation or abscess without bleeding: Secondary | ICD-10-CM | POA: Diagnosis not present

## 2022-04-23 DIAGNOSIS — I10 Essential (primary) hypertension: Secondary | ICD-10-CM | POA: Diagnosis not present

## 2022-04-23 DIAGNOSIS — Z09 Encounter for follow-up examination after completed treatment for conditions other than malignant neoplasm: Secondary | ICD-10-CM | POA: Diagnosis not present

## 2022-04-23 DIAGNOSIS — Z1211 Encounter for screening for malignant neoplasm of colon: Secondary | ICD-10-CM | POA: Diagnosis not present

## 2022-04-23 DIAGNOSIS — Z86010 Personal history of colonic polyps: Secondary | ICD-10-CM | POA: Diagnosis not present

## 2022-04-23 MED ORDER — SODIUM CHLORIDE 0.9 % IV SOLN: 500.0000 mL | Freq: Once | INTRAVENOUS | Status: DC

## 2022-04-23 NOTE — Progress Notes (Signed)
Pt's states no medical or surgical changes since previsit or office visit. 

## 2022-04-23 NOTE — Patient Instructions (Addendum)
Unfortunately the procedure could not be completed due to an inadequate preparation. Not sure why but sometimes this happens.  We will need to reschedule with a different prep. Office will reschedule.   I appreciate the opportunity to care for you. Gatha Mayer, MD, FACG  YOU HAD AN ENDOSCOPIC PROCEDURE TODAY AT [facility]:   Refer to the procedure report that was given to you for any specific questions about what was found during the examination.  If the procedure report does not answer your questions, please call your gastroenterologist to clarify.  If you requested that your care partner not be given the details of your procedure findings, then the procedure report has been included in a sealed envelope for you to review at your convenience later.  YOU SHOULD EXPECT: Some feelings of bloating in the abdomen. Passage of more gas than usual.  Walking can help get rid of the air that was put into your GI tract during the procedure and reduce the bloating. If you had a lower endoscopy (such as a colonoscopy or flexible sigmoidoscopy) you may notice spotting of blood in your stool or on the toilet paper. If you underwent a bowel prep for your procedure, you may not have a normal bowel movement for a few days.  Please Note:  You might notice some irritation and congestion in your nose or some drainage.  This is from the oxygen used during your procedure.  There is no need for concern and it should clear up in a day or so.  SYMPTOMS TO REPORT IMMEDIATELY:  Following lower endoscopy (colonoscopy or flexible sigmoidoscopy):  Excessive amounts of blood in the stool  Significant tenderness or worsening of abdominal pains  Swelling of the abdomen that is new, acute  Fever of 100F or higher   For urgent or emergent issues, a gastroenterologist can be reached at any hour by calling [phone]. Do not use MyChart messaging for urgent concerns.    DIET:  We do recommend a  small meal at first, but then you may proceed to your regular diet.  Drink plenty of fluids but you should avoid alcoholic beverages for 24 hours.  ACTIVITY:  You should plan to take it easy for the rest of today and you should NOT DRIVE or use heavy machinery until tomorrow (because of the sedation medicines used during the test).    FOLLOW UP: Our staff will call the number listed on your records the next business day following your procedure.  We will call around 7:15- 8:00 am to check on you and address any questions or concerns that you may have regarding the information given to you following your procedure. If we do not reach you, we will leave a message.     If any biopsies were taken you will be contacted by phone or by letter within the next 1-3 weeks.  Please call us at [phone] if you have not heard about the biopsies in 3 weeks.    SIGNATURES/CONFIDENTIALITY: You and/or your care partner have signed paperwork which will be entered into your electronic medical record.  These signatures attest to the fact that that the information above on your After Visit Summary has been reviewed and is understood.  Full responsibility of the confidentiality of this discharge information lies with you and/or your care-partner.

## 2022-04-23 NOTE — Progress Notes (Signed)
Report to pacu rn. Vss. Care resumed by rn. 

## 2022-04-23 NOTE — Op Note (Signed)
[place] Patient Name: Steven Larsen Procedure Date: 04/23/2022 10:53 AM MRN: [id number] Endoscopist: Gatha Mayer , MD, [id number] Age: 72 Referring MD:  Date of Birth: 1951-02-02 Gender: Male Account #: [id number] Procedure:                Colonoscopy Indications:              Surveillance: Personal history of adenomatous                            polyps on last colonoscopy > 3 years ago, Last                            colonoscopy: November 2020 Medicines:                Monitored Anesthesia Care Procedure:                Pre-Anesthesia Assessment:                           - Prior to the procedure, a History and Physical                            was performed, and patient medications and                            allergies were reviewed. The patient's tolerance of                            previous anesthesia was also reviewed. The risks                            and benefits of the procedure and the sedation                            options and risks were discussed with the patient.                            All questions were answered, and informed consent                            was obtained. Prior Anticoagulants: The patient has                            taken no anticoagulant or antiplatelet agents. ASA                            Grade Assessment: II - A patient with mild systemic                            disease. After reviewing the risks and benefits,                            the patient was deemed in satisfactory condition to  undergo the procedure.                           After obtaining informed consent, the colonoscope                            was passed under direct vision. Throughout the                            procedure, the patient's blood pressure, pulse, and                            oxygen saturations were monitored continuously. The                            Olympus CF-HQ190L [id number])  Colonoscope was                            introduced through the anus with the intention of                            advancing to the cecum. The scope was advanced to                            the transverse colon before the procedure was                            aborted. Medications were given. The colonoscopy                            was performed with difficulty due to inadequate                            bowel prep. The patient tolerated the procedure                            well. The quality of the bowel preparation was                            poor. The colonoscopy was aborted due to poor                            endoscopic visualization. Scope In: 11:06:20 AM Scope Out: 11:10:19 AM Total Procedure Duration: 0 hours 3 minutes 59 seconds  Findings:                 The perianal and digital rectal examinations were                            normal. Pertinent negatives include normal prostate                            (size, shape, and consistency).  A large amount of stool was found in the rectum, in                            the sigmoid colon, in the descending colon and in                            the transverse colon, interfering with                            visualization.                           Multiple diverticula were found in the sigmoid                            colon. Complications:            No immediate complications. Estimated Blood Loss:     Estimated blood loss: none. Impression:               - Preparation of the colon was poor.                           - The procedure was aborted due to poor endoscopic                            visualization.                           - Stool in the rectum, in the sigmoid colon, in the                            descending colon and in the transverse colon.                           - Diverticulosis in the sigmoid colon.                           - No specimens collected.                            - Personal history of colonic polyps - 5 diminutive                            adenomas 01/2019 Recommendation:           - Patient has a contact number available for                            emergencies. The signs and symptoms of potential                            delayed complications were discussed with the                            patient. Return to normal activities tomorrow.  Written discharge instructions were provided to the                            patient.                           - Resume previous diet.                           - Continue present medications.                           - Reschedule colonoscopy using a double prep such                            as MiraLax/Suprep Gatha Mayer, MD 04/23/2022 11:18:44 AM This report has been signed electronically.

## 2022-04-23 NOTE — Progress Notes (Signed)
[facility] History and Physical   Primary Care Physician:  Copland, Gay Filler, MD   Reason for Procedure:   Hx colon adenomas  Plan:    colonoscopy     HPI: Steven Larsen is a 72 y.o. male here for polyp surveillance  5 diminutive adenomas removed 01/2019 Past Medical History:  Diagnosis Date   CAD (coronary artery disease)    11/18/17 PCI/DES mLAD, normal EF   Colon polyps    High cholesterol    History of kidney stones    Hypertension    Internal hemorrhoid     Past Surgical History:  Procedure Laterality Date   COLONOSCOPY  01/2019   LEFT HEART CATH AND CORONARY ANGIOGRAPHY N/A 11/18/2017   Procedure: LEFT HEART CATH AND CORONARY ANGIOGRAPHY;  Surgeon: Troy Sine, MD;  Location: [facility];  Service: Cardiovascular;  Laterality: N/A;    Prior to Admission medications   Medication Sig Start Date End Date Taking? Authorizing Provider  aspirin EC 81 MG tablet Take 81 mg by mouth daily.    Yes [provider]  metoprolol succinate (TOPROL-XL) 50 MG 24 hr tablet Take 1 tablet (50 mg total) by mouth daily. 11/20/21  Yes Hilty, Nadean Corwin, MD  OVER THE COUNTER MEDICATION Take 1 capsule by mouth daily. Super Beta Prostate    Yes [provider]  OVER THE COUNTER MEDICATION daily. Daily harvest once a day   Yes [provider]  acetaminophen (TYLENOL) 500 MG tablet Take 500 mg by mouth as needed.    [provider]  atorvastatin (LIPITOR) 80 MG tablet Take 1 tablet (80 mg total) by mouth daily. 11/20/21   Hilty, Nadean Corwin, MD  nitroGLYCERIN (NITROSTAT) 0.4 MG SL tablet Place 1 tablet (0.4 mg total) under the tongue every 5 (five) minutes as needed. Patient not taking: Reported on 04/09/2022 11/20/21   Pixie Casino, MD    Current Outpatient Medications  Medication Sig Dispense Refill   aspirin EC 81 MG tablet Take 81 mg by mouth daily.      metoprolol succinate (TOPROL-XL) 50 MG 24 hr tablet Take 1 tablet (50 mg  total) by mouth daily. 90 tablet 3   OVER THE COUNTER MEDICATION Take 1 capsule by mouth daily. Super Beta Prostate      OVER THE COUNTER MEDICATION daily. Daily harvest once a day     acetaminophen (TYLENOL) 500 MG tablet Take 500 mg by mouth as needed.     atorvastatin (LIPITOR) 80 MG tablet Take 1 tablet (80 mg total) by mouth daily. 90 tablet 3   nitroGLYCERIN (NITROSTAT) 0.4 MG SL tablet Place 1 tablet (0.4 mg total) under the tongue every 5 (five) minutes as needed. (Patient not taking: Reported on 04/09/2022) 25 tablet 2   Current Facility-Administered Medications  Medication Dose Route Frequency Provider Last Rate Last Admin   0.9 %  sodium chloride infusion  500 mL Intravenous Once Gatha Mayer, MD        Allergies as of 04/23/2022   (No Known Allergies)    Family History  Problem Relation Age of Onset   Prostate cancer Father    Colon cancer Father 5   Stomach cancer Neg Hx    Esophageal cancer Neg Hx     Social History   Socioeconomic History   Marital status: Married    Spouse name: Chadwick   Number of children: 4   Years of education: Not on file   Highest education  level: Not on file  Occupational History    Employer: [employer]    Comment: Owner  Tobacco Use   Smoking status: Never   Smokeless tobacco: Never  Vaping Use   Vaping Use: Never used  Substance and Sexual Activity   Alcohol use: Never   Drug use: Never   Sexual activity: Yes    Partners: Female  Other Topics Concern   Not on file  Social History Narrative   Married second time, 1 son and 3 daughters biologic and stepchildren   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  Physical Exam: Vital signs BP 126/68   Pulse 62   Temp (!) 96.8 F (36 C)   Resp 13   Ht '5\' 10"'$  (1.778 m)   Wt 211 lb (95.7 kg)   SpO2 98%   BMI 30.28 kg/m   General:   Alert,  Well-developed, well-nourished, pleasant and cooperative in NAD Lungs:  Clear throughout to auscultation.   Heart:  Regular rate and rhythm; no murmurs, clicks, rubs,  or gallops. Abdomen:  Soft, nontender and nondistended. Normal bowel sounds.   Neuro/Psych:  Alert and cooperative. Normal mood and affect. A and O x 3   '@Thana Ramp'$  Simonne Maffucci, MD, [facility] [phone] (pager) 04/23/2022 10:57 AM@

## 2022-04-23 NOTE — Telephone Encounter (Signed)
Needs to reschedule colonoscopy - prep failure on exam today  Please arrange colonoscopy (wants a Friday) - it is not urgent  Prep should be modified double  1) MiraLax purge PM 2 days before colonoscopy 2) Plenvu prep day before colonoscopy

## 2022-04-24 ENCOUNTER — Telehealth: Payer: Self-pay | Admitting: *Deleted

## 2022-04-24 NOTE — Telephone Encounter (Signed)
  Follow up Call-     04/23/2022   10:43 AM  Call back number  Post procedure Call Back phone  # [phone]  Permission to leave phone message Yes     Patient questions:  Do you have a fever, pain , or abdominal swelling? No. Pain Score  0 *  Have you tolerated food without any problems? yes  Have you been able to return to your normal activities? Yes.    Do you have any questions about your discharge instructions: Diet   No. Medications  No. Follow up visit  No.  Do you have questions or concerns about your Care? No.  Actions: * If pain score is 4 or above: No action needed, pain <4.

## 2022-04-25 ENCOUNTER — Encounter: Payer: Self-pay | Admitting: Internal Medicine

## 2022-04-25 MED ORDER — PLENVU 140 G PO SOLR: 1.0000 | Freq: Once | ORAL | 0 refills | Status: AC

## 2022-04-25 NOTE — Telephone Encounter (Signed)
Steven Larsen has been rescheduled to 05/30/2022 at 4pm and Plenvu sample provided. Instructions given to patient.

## 2022-05-27 ENCOUNTER — Encounter: Payer: Self-pay | Admitting: Certified Registered Nurse Anesthetist

## 2022-05-30 ENCOUNTER — Ambulatory Visit (AMBULATORY_SURGERY_CENTER): Payer: Medicare Other | Admitting: Internal Medicine

## 2022-05-30 ENCOUNTER — Encounter: Payer: Self-pay | Admitting: Internal Medicine

## 2022-05-30 VITALS — BP 147/69 | HR 72 | Resp 14 | Ht 70.0 in | Wt 218.0 lb

## 2022-05-30 DIAGNOSIS — Z09 Encounter for follow-up examination after completed treatment for conditions other than malignant neoplasm: Secondary | ICD-10-CM

## 2022-05-30 DIAGNOSIS — I2510 Atherosclerotic heart disease of native coronary artery without angina pectoris: Secondary | ICD-10-CM | POA: Diagnosis not present

## 2022-05-30 DIAGNOSIS — Z860100 Personal history of colon polyps, unspecified: Secondary | ICD-10-CM | POA: Insufficient documentation

## 2022-05-30 DIAGNOSIS — I10 Essential (primary) hypertension: Secondary | ICD-10-CM | POA: Diagnosis not present

## 2022-05-30 DIAGNOSIS — D123 Benign neoplasm of transverse colon: Secondary | ICD-10-CM | POA: Diagnosis not present

## 2022-05-30 MED ORDER — SODIUM CHLORIDE 0.9 % IV SOLN: 500.0000 mL | INTRAVENOUS | Status: DC

## 2022-05-30 NOTE — Patient Instructions (Addendum)
I found and removed one tiny polyp that I feel certain is benign. I will let you know pathology results and when to have another routine colonoscopy by mail and/or My Chart.  You also have a condition called diverticulosis - common and not usually a problem. Please read the handout provided.  I appreciate the opportunity to care for you. Gatha Mayer, MD, [facility]    Handouts on polyps and diverticulosis provided.   YOU HAD AN ENDOSCOPIC PROCEDURE TODAY AT [facility]:   Refer to the procedure report that was given to you for any specific questions about what was found during the examination.  If the procedure report does not answer your questions, please call your gastroenterologist to clarify.  If you requested that your care partner not be given the details of your procedure findings, then the procedure report has been included in a sealed envelope for you to review at your convenience later.  YOU SHOULD EXPECT: Some feelings of bloating in the abdomen. Passage of more gas than usual.  Walking can help get rid of the air that was put into your GI tract during the procedure and reduce the bloating. If you had a lower endoscopy (such as a colonoscopy or flexible sigmoidoscopy) you may notice spotting of blood in your stool or on the toilet paper. If you underwent a bowel prep for your procedure, you may not have a normal bowel movement for a few days.  Please Note:  You might notice some irritation and congestion in your nose or some drainage.  This is from the oxygen used during your procedure.  There is no need for concern and it should clear up in a day or so.  SYMPTOMS TO REPORT IMMEDIATELY:  Following lower endoscopy (colonoscopy or flexible sigmoidoscopy):  Excessive amounts of blood in the stool  Significant tenderness or worsening of abdominal pains  Swelling of the abdomen that is new, acute  Fever of 100F or higher   For urgent or emergent issues, a  gastroenterologist can be reached at any hour by calling [phone]. Do not use MyChart messaging for urgent concerns.    DIET:  We do recommend a small meal at first, but then you may proceed to your regular diet.  Drink plenty of fluids but you should avoid alcoholic beverages for 24 hours.  ACTIVITY:  You should plan to take it easy for the rest of today and you should NOT DRIVE or use heavy machinery until tomorrow (because of the sedation medicines used during the test).    FOLLOW UP: Our staff will call the number listed on your records the next business day following your procedure.  We will call around 7:15- 8:00 am to check on you and address any questions or concerns that you may have regarding the information given to you following your procedure. If we do not reach you, we will leave a message.     If any biopsies were taken you will be contacted by phone or by letter within the next 1-3 weeks.  Please call us at [phone] if you have not heard about the biopsies in 3 weeks.    SIGNATURES/CONFIDENTIALITY: You and/or your care partner have signed paperwork which will be entered into your electronic medical record.  These signatures attest to the fact that that the information above on your After Visit Summary has been reviewed and is understood.  Full responsibility of the confidentiality of this discharge information lies with you  and/or your care-partner.

## 2022-05-30 NOTE — Op Note (Signed)
[facility] Patient Name: Ahron Degner Procedure Date: 05/30/2022 4:07 PM MRN: [id number] Endoscopist: Gatha Mayer , MD, [id number] Age: 72 Referring MD:  Date of Birth: [birth year] Gender: Male Account #: [id number] Procedure:                Colonoscopy Indications:              Surveillance: Personal history of adenomatous                            polyps on last colonoscopy > 3 years ago, Last                            colonoscopy: November 2020 Medicines:                Monitored Anesthesia Care Procedure:                Pre-Anesthesia Assessment:                           - Prior to the procedure, a History and Physical                            was performed, and patient medications and                            allergies were reviewed. The patient's tolerance of                            previous anesthesia was also reviewed. The risks                            and benefits of the procedure and the sedation                            options and risks were discussed with the patient.                            All questions were answered, and informed consent                            was obtained. Prior Anticoagulants: The patient has                            taken no anticoagulant or antiplatelet agents. ASA                            Grade Assessment: III - A patient with severe                            systemic disease. After reviewing the risks and                            benefits, the patient was deemed in satisfactory  condition to undergo the procedure.                           After obtaining informed consent, the colonoscope                            was passed under direct vision. Throughout the                            procedure, the patient's blood pressure, pulse, and                            oxygen saturations were monitored continuously. The                            Olympus CF-HQ190L [id number])  Colonoscope was                            introduced through the anus and advanced to the the                            cecum, identified by appendiceal orifice and                            ileocecal valve. The colonoscopy was performed                            without difficulty. The patient tolerated the                            procedure well. The quality of the bowel                            preparation was good. The ileocecal valve,                            appendiceal orifice, and rectum were photographed.                            The bowel preparation used was Plenvu via extended                            prep (MiraLax purge day before)with split dose                            instruction. Scope In: 4:22:30 PM Scope Out: 4:38:08 PM Scope Withdrawal Time: 0 hours 12 minutes 57 seconds  Total Procedure Duration: 0 hours 15 minutes 38 seconds  Findings:                 The perianal and digital rectal examinations were                            normal. Pertinent negatives include normal prostate                            (  size, shape, and consistency).                           A 2 mm polyp was found in the transverse colon. The                            polyp was sessile. The polyp was removed with a                            cold snare. Resection and retrieval were complete.                            Verification of patient identification for the                            specimen was done. Estimated blood loss was minimal.                           Multiple diverticula were found in the sigmoid                            colon.                           The exam was otherwise without abnormality on                            direct and retroflexion views. Complications:            No immediate complications. Estimated Blood Loss:     Estimated blood loss was minimal. Impression:               - One 2 mm polyp in the transverse colon, removed                             with a cold snare. Resected and retrieved.                           - Diverticulosis in the sigmoid colon.                           - The examination was otherwise normal on direct                            and retroflexion views.                           - Personal history of colonic polyps. 5 diminutive                            adenomas 01/2019 Recommendation:           - Patient has a contact number available for                            emergencies. The signs and  symptoms of potential                            delayed complications were discussed with the                            patient. Return to normal activities tomorrow.                            Written discharge instructions were provided to the                            patient.                           - Resume previous diet.                           - Continue present medications.                           - Await pathology results.                           - Repeat colonoscopy is recommended for                            surveillance. The colonoscopy date will be                            determined after pathology results from today's                            exam become available for review. After MiraLax                            prep failure he did Plenvu + MiraLax pure day                            before this time and prep was good Gatha Mayer, MD 05/30/2022 4:45:54 PM This report has been signed electronically.

## 2022-05-30 NOTE — Progress Notes (Unsigned)
[facility] History and Physical   Primary Care Physician:  No primary care provider on file.   Reason for Procedure:    Encounter Diagnosis  Name Primary?   Personal history of colonic polyps Yes     Plan:    colonoscopy     HPI: Steven Larsen is a 72 y.o. male s/p removal 5 adenomas in 2020  2/7 attempted colonoscopy poor prep - rescheduled to today   Past Medical History:  Diagnosis Date   CAD (coronary artery disease)    11/18/17 PCI/DES mLAD, normal EF   Colon polyps    High cholesterol    History of kidney stones    Hypertension    Internal hemorrhoid     Past Surgical History:  Procedure Laterality Date   COLONOSCOPY  01/2019   LEFT HEART CATH AND CORONARY ANGIOGRAPHY N/A 11/18/2017   Procedure: LEFT HEART CATH AND CORONARY ANGIOGRAPHY;  Surgeon: Troy Sine, MD;  Location: [facility];  Service: Cardiovascular;  Laterality: N/A;    Prior to Admission medications   Medication Sig Start Date End Date Taking? Authorizing Provider  aspirin EC 81 MG tablet Take 81 mg by mouth daily.    Yes [provider]  atorvastatin (LIPITOR) 80 MG tablet Take 1 tablet (80 mg total) by mouth daily. 11/20/21  Yes Hilty, Nadean Corwin, MD  metoprolol succinate (TOPROL-XL) 50 MG 24 hr tablet Take 1 tablet (50 mg total) by mouth daily. 11/20/21  Yes Hilty, Nadean Corwin, MD  acetaminophen (TYLENOL) 500 MG tablet Take 500 mg by mouth as needed.    [provider]  nitroGLYCERIN (NITROSTAT) 0.4 MG SL tablet Place 1 tablet (0.4 mg total) under the tongue every 5 (five) minutes as needed. Patient not taking: Reported on 04/09/2022 11/20/21   Pixie Casino, MD  OVER THE COUNTER MEDICATION Take 1 capsule by mouth daily. Super Beta Prostate     [provider]  OVER THE COUNTER MEDICATION daily. Daily harvest once a day    [provider]    Current Outpatient Medications  Medication Sig Dispense Refill   aspirin EC 81 MG tablet Take 81  mg by mouth daily.      atorvastatin (LIPITOR) 80 MG tablet Take 1 tablet (80 mg total) by mouth daily. 90 tablet 3   metoprolol succinate (TOPROL-XL) 50 MG 24 hr tablet Take 1 tablet (50 mg total) by mouth daily. 90 tablet 3   acetaminophen (TYLENOL) 500 MG tablet Take 500 mg by mouth as needed.     nitroGLYCERIN (NITROSTAT) 0.4 MG SL tablet Place 1 tablet (0.4 mg total) under the tongue every 5 (five) minutes as needed. (Patient not taking: Reported on 04/09/2022) 25 tablet 2   OVER THE COUNTER MEDICATION Take 1 capsule by mouth daily. Super Beta Prostate      OVER THE COUNTER MEDICATION daily. Daily harvest once a day     Current Facility-Administered Medications  Medication Dose Route Frequency Provider Last Rate Last Admin   0.9 %  sodium chloride infusion  500 mL Intravenous Continuous Gatha Mayer, MD        Allergies as of 05/30/2022   (No Known Allergies)    Family History  Problem Relation Age of Onset   Osteoporosis Mother    Bipolar disorder Mother    Hypertension Mother    Bladder Cancer Father 63   Osteoporosis Sister    Hyperlipidemia Sister    Osteoporosis Sister    Hyperlipidemia  Sister    Other Sister        MAC   Hypothyroidism Sister    COPD Brother    Hypertension Brother    Colon cancer Paternal Grandmother    Stomach cancer Neg Hx    Esophageal cancer Neg Hx     Social History   Socioeconomic History   Marital status: Married    Spouse name: Waller   Number of children: 4   Years of education: Not on file   Highest education level: Not on file  Occupational History    Employer: [employer]    Comment: Owner  Tobacco Use   Smoking status: Never   Smokeless tobacco: Never  Vaping Use   Vaping Use: Never used  Substance and Sexual Activity   Alcohol use: Never   Drug use: Never   Sexual activity: Yes    Partners: Female  Other Topics Concern   Not on file  Social History Narrative   Married second time, 1 son and 3 daughters  biologic and stepchildren   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  Physical Exam: Vital signs BP 135/68   Pulse 65   Ht 5\' 10"  (1.778 m)   Wt 218 lb (98.9 kg)   SpO2 99%   BMI 31.28 kg/m   General:   Alert,  Well-developed, well-nourished, pleasant and cooperative in NAD Lungs:  Clear throughout to auscultation.   Heart:  Regular rate and rhythm; no murmurs, clicks, rubs,  or gallops. Abdomen:  Soft, nontender and nondistended. Normal bowel sounds.   Neuro/Psych:  Alert and cooperative. Normal mood and affect. A and O x 3   @Deetra Booton  Simonne Maffucci, MD, [facility] [phone] (pager) 05/30/2022 4:15 PM@

## 2022-05-30 NOTE — Progress Notes (Unsigned)
Report given to PACU, vss 

## 2022-05-30 NOTE — Progress Notes (Unsigned)
Called to room to assist during endoscopic procedure.  Patient ID and intended procedure confirmed with present staff. Received instructions for my participation in the procedure from the performing physician.  

## 2022-06-02 ENCOUNTER — Telehealth: Payer: Self-pay

## 2022-06-02 NOTE — Telephone Encounter (Signed)
Attempted f/u call. No answer, left VM. 

## 2022-06-07 ENCOUNTER — Encounter: Payer: Self-pay | Admitting: Internal Medicine

## 2022-11-23 ENCOUNTER — Other Ambulatory Visit: Payer: Self-pay | Admitting: Internal Medicine

## 2022-11-25 ENCOUNTER — Encounter: Payer: Self-pay | Admitting: Physician Assistant

## 2022-11-25 ENCOUNTER — Ambulatory Visit (INDEPENDENT_AMBULATORY_CARE_PROVIDER_SITE_OTHER): Payer: Medicare Other | Admitting: Physician Assistant

## 2022-11-25 VITALS — BP 138/82 | HR 65 | Temp 98.2°F | Resp 20 | Ht 69.0 in | Wt 229.2 lb

## 2022-11-25 DIAGNOSIS — E7800 Pure hypercholesterolemia, unspecified: Secondary | ICD-10-CM | POA: Diagnosis not present

## 2022-11-25 DIAGNOSIS — I10 Essential (primary) hypertension: Secondary | ICD-10-CM

## 2022-11-25 DIAGNOSIS — R351 Nocturia: Secondary | ICD-10-CM | POA: Diagnosis not present

## 2022-11-25 DIAGNOSIS — R739 Hyperglycemia, unspecified: Secondary | ICD-10-CM

## 2022-11-25 DIAGNOSIS — Z0000 Encounter for general adult medical examination without abnormal findings: Secondary | ICD-10-CM

## 2022-11-25 LAB — COMPREHENSIVE METABOLIC PANEL
ALT: 19 U/L (ref 0–53)
AST: 16 U/L (ref 0–37)
Albumin: 4.2 g/dL (ref 3.5–5.2)
Alkaline Phosphatase: 73 U/L (ref 39–117)
BUN: 20 mg/dL (ref 6–23)
CO2: 29 meq/L (ref 19–32)
Calcium: 9.7 mg/dL (ref 8.4–10.5)
Chloride: 102 meq/L (ref 96–112)
Creatinine, Ser: 0.84 mg/dL (ref 0.40–1.50)
GFR: 88.23 mL/min (ref >60.00)
Glucose, Bld: 110 mg/dL — ABNORMAL HIGH (ref 70–99)
Potassium: 4.8 meq/L (ref 3.5–5.1)
Sodium: 138 meq/L (ref 135–145)
Total Bilirubin: 1.1 mg/dL (ref 0.2–1.2)
Total Protein: 6.8 g/dL (ref 6.0–8.3)

## 2022-11-25 LAB — PSA: PSA: 2.63 ng/mL (ref 0.10–4.00)

## 2022-11-25 LAB — CBC WITH DIFFERENTIAL/PLATELET
Basophils Absolute: 0.1 10*3/uL (ref 0.0–0.1)
Basophils Relative: 0.9 % (ref 0.0–3.0)
Eosinophils Absolute: 0.1 10*3/uL (ref 0.0–0.7)
Eosinophils Relative: 1.2 % (ref 0.0–5.0)
HCT: 45.7 % (ref 39.0–52.0)
Hemoglobin: 15.2 g/dL (ref 13.0–17.0)
Lymphocytes Relative: 26.3 % (ref 12.0–46.0)
Lymphs Abs: 1.7 10*3/uL (ref 0.7–4.0)
MCHC: 33.2 g/dL (ref 30.0–36.0)
MCV: 95.6 fl (ref 78.0–100.0)
Monocytes Absolute: 0.6 10*3/uL (ref 0.1–1.0)
Monocytes Relative: 9.5 % (ref 3.0–12.0)
Neutro Abs: 3.9 10*3/uL (ref 1.4–7.7)
Neutrophils Relative %: 62.1 % (ref 43.0–77.0)
Platelets: 189 10*3/uL (ref 150.0–400.0)
RBC: 4.78 Mil/uL (ref 4.22–5.81)
RDW: 13.3 % (ref 11.5–15.5)
WBC: 6.4 10*3/uL (ref 4.0–10.5)

## 2022-11-25 LAB — HEMOGLOBIN A1C: Hgb A1c MFr Bld: 6 % (ref 4.6–6.5)

## 2022-11-25 LAB — LIPID PANEL
Cholesterol: 215 mg/dL — ABNORMAL HIGH (ref 0–200)
HDL: 52.3 mg/dL (ref >39.00)
LDL Cholesterol: 140 mg/dL — ABNORMAL HIGH (ref 0–99)
NonHDL: 163.13
Total CHOL/HDL Ratio: 4
Triglycerides: 117 mg/dL (ref 0.0–149.0)
VLDL: 23.4 mg/dL (ref 0.0–40.0)

## 2022-11-25 NOTE — Assessment & Plan Note (Signed)
chronic, well controlled -Continue metoprolol 50 mg daily.  -ordered cmp f/b cardiology

## 2022-11-25 NOTE — Assessment & Plan Note (Addendum)
Patient has discontinued cholesterol medication due to concerns about side effects, despite no personal experience of side effects.  He expresses understanding of the potential risks of discontinuing the medication, including the increased risk of heart attack, but prefers to avoid the medication unless he sees a clear need for it Discussed the importance of cholesterol management in preventing heart disease and stroke, but patient remains hesitant. -Recommend restarting lipitor 80 mg given hx of PCI in 2019.  Will monitor cholesterol levels in blood work.

## 2022-11-25 NOTE — Progress Notes (Signed)
New patient visit   Patient: Steven Larsen   DOB: [birth year]   72 y.o. Male  MRN: [id number] Visit Date: 11/25/2022  Today's healthcare provider: Alfredia Ferguson, PA-C   Cc. Reeestablish care, cpe  Subjective    Steven Larsen is a 72 y.o. male who presents today as a new patient to establish care.  HPI  Discussed the use of AI scribe software for clinical note transcription with the patient, who gave verbal consent to proceed.  History of Present Illness   The patient, with a history of heart issues and high cholesterol, presents for re-establishment of care. He has been consistently seeing a cardiologist and has no current complaints. The patient has decided to stop taking cholesterol medication due to concerns about potential side effects, despite not having experienced any personally. . The patient also has a history of high blood pressure, which is currently well-controlled. He reports no changes in urinary habits, but before taking a supplement otc he was experiencing nocturia up to four times a night. The patient works long hours and is physically active, maintaining a diet that includes daily salads. He does not smoke and consume alcohol infrequently.        Past Medical History:  Diagnosis Date   CAD (coronary artery disease)    11/18/17 PCI/DES mLAD, normal EF   Colon polyps    High cholesterol    History of kidney stones    Hypertension    Internal hemorrhoid    Past Surgical History:  Procedure Laterality Date   COLONOSCOPY  01/2019   LEFT HEART CATH AND CORONARY ANGIOGRAPHY N/A 11/18/2017   Procedure: LEFT HEART CATH AND CORONARY ANGIOGRAPHY;  Surgeon: Lennette Bihari, MD;  Location: [facility];  Service: Cardiovascular;  Laterality: N/A;   Family Status  Relation Name Status   Mother Polly Deceased   Father Renae Fickle Deceased   Sister Gunnar Fusi Alive   Sister Patti Alive   Brother Penn Alive   MGM  Deceased   MGF  Deceased   PGM Vallie Deceased   PGF  Economist Deceased   Neg Hx  (Not Specified)  No partnership data on file   Family History  Problem Relation Age of Onset   Osteoporosis Mother    Bipolar disorder Mother    Hypertension Mother    Bladder Cancer Father 71   Osteoporosis Sister    Hyperlipidemia Sister    Osteoporosis Sister    Hyperlipidemia Sister    Other Sister        MAC   Hypothyroidism Sister    COPD Brother    Hypertension Brother    Colon cancer Paternal Grandmother    Stomach cancer Neg Hx    Esophageal cancer Neg Hx    Social History   Socioeconomic History   Marital status: Married    Spouse name: Mary   Number of children: 4   Years of education: Not on file   Highest education level: Not on file  Occupational History    Employer: [employer]    Comment: Owner  Tobacco Use   Smoking status: Never   Smokeless tobacco: Never  Vaping Use   Vaping status: Never Used  Substance and Sexual Activity   Alcohol use: Never   Drug use: Never   Sexual activity: Yes    Partners: Female  Other Topics Concern   Not on file  Social History Narrative   Married second time, 1 son and 3  daughters biologic and stepchildren   Owner of [employer]   Never smoker   No alcohol   3 caffeinated beverages daily   Social Determinants of Corporate investment banker Strain: Not on file  Food Insecurity: Not on file  Transportation Needs: Not on file  Physical Activity: Not on file  Stress: Not on file  Social Connections: Not on file   Outpatient Medications Prior to Visit  Medication Sig   aspirin EC 81 MG tablet Take 81 mg by mouth daily.    metoprolol succinate (TOPROL-XL) 50 MG 24 hr tablet TAKE 1 TABLET BY MOUTH EVERY DAY   nitroGLYCERIN (NITROSTAT) 0.4 MG SL tablet Place 1 tablet (0.4 mg total) under the tongue every 5 (five) minutes as needed.   OVER THE COUNTER MEDICATION daily.  Super Beets Daily harvest once a day   acetaminophen (TYLENOL) 500 MG tablet Take 500 mg  by mouth as needed. (Patient not taking: Reported on 11/25/2022)   atorvastatin (LIPITOR) 80 MG tablet Take 1 tablet (80 mg total) by mouth daily. (Patient not taking: Reported on 11/25/2022)   OVER THE COUNTER MEDICATION Take 1 capsule by mouth daily. Super Beta Prostate    No facility-administered medications prior to visit.   No Known Allergies  Immunization History  Administered Date(s) Administered   Td 11/25/2018    Health Maintenance  Topic Date Due   Medicare Annual Wellness (AWV)  Never done   Zoster Vaccines- Shingrix (1 of 2) Never done   Pneumonia Vaccine 71+ Years old (1 of 1 - PCV) Never done   INFLUENZA VACCINE  Never done   COVID-19 Vaccine (1 - 2023-24 season) Never done   Colonoscopy  05/30/2027   DTaP/Tdap/Td (2 - Tdap) 11/24/2028   Hepatitis C Screening  Completed   HPV VACCINES  Aged Out    Patient Care Team: Alfredia Ferguson, PA-C as PCP - General (Physician Assistant) Chrystie Nose, MD as PCP - Cardiology (Cardiology)  Review of Systems  Constitutional:  Negative for fatigue and fever.  Respiratory:  Negative for cough and shortness of breath.   Cardiovascular:  Negative for chest pain, palpitations and leg swelling.  Genitourinary:  Positive for frequency.  Neurological:  Negative for dizziness and headaches.      Objective    BP 138/82 (BP Location: Left Arm, Patient Position: Sitting, Cuff Size: Normal)   Pulse 65   Temp 98.2 F (36.8 C) (Oral)   Resp 20   Ht 5\' 9"  (1.753 m)   Wt 229 lb 3.2 oz (104 kg)   SpO2 98%   BMI 33.85 kg/m   Physical Exam Constitutional:      General: He is awake.     Appearance: He is well-developed.  HENT:     Head: Normocephalic.     Right Ear: Tympanic membrane, ear canal and external ear normal.     Left Ear: Tympanic membrane, ear canal and external ear normal.     Nose: Nose normal. No congestion or rhinorrhea.     Mouth/Throat:     Mouth: Mucous membranes are moist.     Pharynx: No oropharyngeal  exudate or posterior oropharyngeal erythema.  Eyes:     Pupils: Pupils are equal, round, and reactive to light.  Cardiovascular:     Rate and Rhythm: Normal rate and regular rhythm.     Heart sounds: Normal heart sounds.  Pulmonary:     Effort: Pulmonary effort is normal.     Breath  sounds: Normal breath sounds.  Abdominal:     General: There is no distension.     Palpations: Abdomen is soft.     Tenderness: There is no abdominal tenderness. There is no guarding.  Musculoskeletal:     Cervical back: Normal range of motion.     Right lower leg: No edema.     Left lower leg: No edema.  Lymphadenopathy:     Cervical: No cervical adenopathy.  Skin:    General: Skin is warm.  Neurological:     Mental Status: He is alert and oriented to person, place, and time.  Psychiatric:        Attention and Perception: Attention normal.        Mood and Affect: Mood normal.        Speech: Speech normal.        Behavior: Behavior normal. Behavior is cooperative.     Depression Screen    11/25/2022   10:45 AM  PHQ 2/9 Scores  PHQ - 2 Score 0   No results found for any visits on 11/25/22.  Assessment & Plan      Problem List Items Addressed This Visit       Cardiovascular and Mediastinum   Hypertension    chronic, well controlled -Continue metoprolol 50 mg daily.  -ordered cmp f/b cardiology      Relevant Orders   Comp Met (CMET)   CBC w/Diff     Other   Hyperlipidemia    Patient has discontinued cholesterol medication due to concerns about side effects, despite no personal experience of side effects.  He expresses understanding of the potential risks of discontinuing the medication, including the increased risk of heart attack, but prefers to avoid the medication unless he sees a clear need for it Discussed the importance of cholesterol management in preventing heart disease and stroke, but patient remains hesitant. -Recommend restarting lipitor 80 mg given hx of PCI in 2019.   Will monitor cholesterol levels in blood work.      Relevant Orders   Lipid panel   Other Visit Diagnoses     Annual physical exam    -  Primary   Relevant Orders   Comp Met (CMET)   CBC w/Diff   Lipid panel   HgB A1c   Hyperglycemia       Relevant Orders   HgB A1c   Nocturia       Relevant Orders   PSA        General Health Maintenance Up to date on colonoscopy. Declined vaccines at this visit--advised shingles, tetanus, flu, pneumonia.  General recommendations: --balanced diet high in fiber and protein, low in sugars, carbs, fats. --physical activity/exercise 20-30 minutes 3-5 times a week        Return in about 1 year (around 11/25/2023) for CPE; or earlier if needed.     I, Alfredia Ferguson, PA-C have reviewed all documentation for this visit. The documentation on  11/25/22   for the exam, diagnosis, procedures, and orders are all accurate and complete.    Alfredia Ferguson, PA-C  [facility] [phone] (phone) [phone] (fax)  [facility]

## 2022-11-26 ENCOUNTER — Encounter: Payer: Self-pay | Admitting: Physician Assistant

## 2022-11-26 ENCOUNTER — Other Ambulatory Visit: Payer: Self-pay

## 2022-12-10 ENCOUNTER — Ambulatory Visit (HOSPITAL_COMMUNITY): Payer: Medicare Other | Attending: Physician Assistant

## 2022-12-10 DIAGNOSIS — R9431 Abnormal electrocardiogram [ECG] [EKG]: Secondary | ICD-10-CM | POA: Diagnosis not present

## 2022-12-10 DIAGNOSIS — I4510 Unspecified right bundle-branch block: Secondary | ICD-10-CM | POA: Insufficient documentation

## 2022-12-10 LAB — ECHOCARDIOGRAM COMPLETE
Area-P 1/2: 3.45 cm2
P 1/2 time: 601 msec
S' Lateral: 3.1 cm

## 2022-12-18 ENCOUNTER — Telehealth: Payer: Self-pay

## 2022-12-18 NOTE — Telephone Encounter (Addendum)
Called patient regarding results, patient had understanding of results.  ----- Message from Steven Larsen sent at 12/13/2022  9:11 PM EDT ----- Normal pumping function of heart. Mild to moderate mitral valve leakage, mild to moderate aortic leakage, mildly dilated aortic root. Overall reassuring study. No significant abnormality

## 2023-01-09 ENCOUNTER — Ambulatory Visit: Payer: Medicare Other | Attending: Internal Medicine | Admitting: Internal Medicine

## 2023-01-09 ENCOUNTER — Encounter: Payer: Self-pay | Admitting: Internal Medicine

## 2023-01-09 VITALS — BP 130/66 | HR 71 | Ht 70.5 in | Wt 228.8 lb

## 2023-01-09 DIAGNOSIS — E785 Hyperlipidemia, unspecified: Secondary | ICD-10-CM | POA: Diagnosis not present

## 2023-01-09 DIAGNOSIS — I10 Essential (primary) hypertension: Secondary | ICD-10-CM | POA: Diagnosis not present

## 2023-01-09 DIAGNOSIS — I2510 Atherosclerotic heart disease of native coronary artery without angina pectoris: Secondary | ICD-10-CM | POA: Diagnosis not present

## 2023-01-09 NOTE — Patient Instructions (Signed)
Medication Instructions:  No Change  *If you need a refill on your cardiac medications before your next appointment, please call your pharmacy*     Follow-Up: At [facility], you and your health needs are our priority.  As part of our continuing mission to provide you with exceptional heart care, we have created designated Provider Care Teams.  These Care Teams include your primary Cardiologist (physician) and Advanced Practice Providers (APPs -  Physician Assistants and Nurse Practitioners) who all work together to provide you with the care you need, when you need it.  We recommend signing up for the patient portal called "MyChart".  Sign up information is provided on this After Visit Summary.  MyChart is used to connect with patients for Virtual Visits (Telemedicine).  Patients are able to view lab/test results, encounter notes, upcoming appointments, etc.  Non-urgent messages can be sent to your provider as well.   To learn more about what you can do with MyChart, go to [email or web address].    Your next appointment:   1 year(s)  Provider:   Chrystie Nose, MD

## 2023-01-09 NOTE — Progress Notes (Unsigned)
OFFICE CONSULT NOTE  Chief Complaint:  Follow-up  Primary Care Physician: Alfredia Ferguson, PA-C  HPI:  Steven Larsen is a 72 y.o. male who is being seen today for the evaluation of exertional dyspnea, chest discomfort at the request of Dr. Leone Payor. 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  Recently he was push mowing the yard and noted onset of chest tightness and burning which resolved at rest.  The symptoms then came back when he started working again.  This happened several times and then eventually stopped.  There was originally concern for possible reflux and he was seen by Dr. Concha Se with [facility].  Because of the exertional nature of his symptoms, he was concerned that this may represent unstable angina.  Steven Larsen, has not had any routine medical care over the past several years.  He does not currently take medications or carry any outstanding diagnoses.  Family history is not significant for early onset heart disease, mostly notable for cancer in his father.  He reports taking some reflux medications without any benefit.  Blood pressure today noted to be elevated in the upper 140s over 90s.  He is not known to have hypertension.  03/02/2018  Steven Larsen returns today for follow-up of heart cath.  When I saw him in the office he was complaining of symptoms concerning for unstable angina and he was referred directly for heart catheterization.  This was performed on 11/18/2017, and demonstrated single-vessel coronary disease with a 95% eccentric mid LAD stenosis after a second diagonal vessel.  LVEF was normal at 55%.  He underwent PCI to the LAD with excellent result.  Ultimately was switched to aspirin and Brilinta.  Since then on follow-up he has been doing well.  His blood sugar was noted to be elevated and his cholesterol has  significantly improved to LDL of 55 on high potency statin.  He denies any shortness of breath or recurrent chest pain.  He is managed to lose about 20 pounds with significant dietary changes.  01/04/2020  Steven Larsen is seen today in follow-up.  Overall he continues to do well.  In the interim he was seen by Judy Pimple, PA-C who discontinued his Brilinta.  He remains on aspirin, statin and metoprolol.  He denies any chest pain or worsening shortness of breath.  Blood pressure is very well controlled.  Lipids last year were well controlled with an LDL of 60.  He is due for repeat lipids.  EKG today shows normal sinus rhythm.  He says he is also managed to lose several pounds.  PMHx:  Past Medical History:  Diagnosis Date   CAD (coronary artery disease)    11/18/17 PCI/DES mLAD, normal EF   Colon polyps    High cholesterol    History of kidney stones    Hypertension    Internal hemorrhoid     Past Surgical History:  Procedure Laterality Date   COLONOSCOPY  01/2019   LEFT HEART CATH AND CORONARY ANGIOGRAPHY N/A 11/18/2017   Procedure: LEFT HEART CATH AND CORONARY ANGIOGRAPHY;  Surgeon: Lennette Bihari, MD;  Location: [facility] INVASIVE CV [facility];  Service: Cardiovascular;  Laterality: N/A;    FAMHx:  Family History  Problem Relation Age of Onset   Osteoporosis Mother    Bipolar disorder Mother    Hypertension Mother  Bladder Cancer Father 6   Osteoporosis Sister    Hyperlipidemia Sister    Osteoporosis Sister    Hyperlipidemia Sister    Other Sister        MAC   Hypothyroidism Sister    COPD Brother    Hypertension Brother    Colon cancer Paternal Grandmother    Stomach cancer Neg Hx    Esophageal cancer Neg Hx     SOCHx:   reports that he has never smoked. He has never used smokeless tobacco. He reports that he does not drink alcohol and does not use drugs.  ALLERGIES:  No Known Allergies  ROS: Pertinent items noted in HPI and remainder of comprehensive ROS otherwise  negative.  HOME MEDS: Current Outpatient Medications on File Prior to Visit  Medication Sig Dispense Refill   acetaminophen (TYLENOL) 500 MG tablet Take 500 mg by mouth as needed.     aspirin EC 81 MG tablet Take 81 mg by mouth daily.      atorvastatin (LIPITOR) 80 MG tablet Take 1 tablet (80 mg total) by mouth daily. 90 tablet 3   metoprolol succinate (TOPROL-XL) 50 MG 24 hr tablet TAKE 1 TABLET BY MOUTH EVERY DAY 90 tablet 3   nitroGLYCERIN (NITROSTAT) 0.4 MG SL tablet Place 1 tablet (0.4 mg total) under the tongue every 5 (five) minutes as needed. 25 tablet 2   OVER THE COUNTER MEDICATION Take 1 capsule by mouth daily. Super Beta Prostate      OVER THE COUNTER MEDICATION daily.  Super Beets Daily harvest once a day     No current facility-administered medications on file prior to visit.    LABS/IMAGING: No results found for this or any previous visit (from the past 48 hour(s)). No results found.  LIPID PANEL:    Component Value Date/Time   CHOL 215 (H) 11/25/2022 1117   CHOL 117 11/21/2021 1517   TRIG 117.0 11/25/2022 1117   HDL 52.30 11/25/2022 1117   HDL 46 11/21/2021 1517   CHOLHDL 4 11/25/2022 1117   VLDL 23.4 11/25/2022 1117   LDLCALC 140 (H) 11/25/2022 1117   LDLCALC 57 11/21/2021 1517    WEIGHTS: Wt Readings from Last 3 Encounters:  01/09/23 228 lb 12.8 oz (103.8 kg)  11/25/22 229 lb 3.2 oz (104 kg)  05/30/22 218 lb (98.9 kg)    VITALS: BP 130/66 (BP Location: Left Arm, Patient Position: Sitting, Cuff Size: Large)   Pulse 71   Ht 5' 10.5" (1.791 m)   Wt 228 lb 12.8 oz (103.8 kg)   SpO2 95%   BMI 32.37 kg/m   EXAM: General appearance: alert and no distress Neck: no carotid bruit, no JVD and thyroid not enlarged, symmetric, no tenderness/mass/nodules Lungs: clear to auscultation bilaterally Heart: regular rate and rhythm, S1, S2 normal, no murmur, click, rub or gallop Abdomen: soft, non-tender; bowel sounds normal; no masses,  no organomegaly and  obese Extremities: extremities normal, atraumatic, no cyanosis or edema Pulses: 2+ and symmetric Skin: Skin color, texture, turgor normal. No rashes or lesions Neurologic: Grossly normal Psych: Pleasant  EKG: Sinus rhythm at 65, possible left atrial margin-personally reviewed  ASSESSMENT: Coronary artery disease status post PCI to the mid LAD (11/2017) Dyslipidemia Hypertension  PLAN: 1.   Steven Larsen continues to do well after PCI to the LAD in 2019.  Cholesterol is very well controlled and is due to be repeated.  Blood pressure is at goal.  He remains on aspirin, statin and beta-blocker.  He  is physically active and recently has lost some weight.  He is trying to continue to make dietary changes.  Overall he is doing well I will plan to see him back annually or sooner as necessary.  Chrystie Nose, MD, [facility], FACP  [place]  [facility]  Medical Director of the Advanced Lipid Disorders &  Cardiovascular Risk Reduction Clinic Diplomate of the American Board of Clinical Lipidology Attending Cardiologist  Direct Dial: [phone]  Fax: [phone]  Website:  [email or web address] Nicely Chelcie Estorga 01/09/2023, 4:07 PM

## 2023-06-29 ENCOUNTER — Other Ambulatory Visit: Payer: Self-pay | Admitting: Internal Medicine

## 2023-09-17 ENCOUNTER — Ambulatory Visit

## 2023-09-23 ENCOUNTER — Encounter: Payer: Self-pay | Admitting: Physician Assistant

## 2023-09-23 ENCOUNTER — Ambulatory Visit: Admitting: Physician Assistant

## 2023-09-23 VITALS — BP 131/73 | HR 62 | Ht 70.5 in | Wt 218.6 lb

## 2023-09-23 DIAGNOSIS — E7800 Pure hypercholesterolemia, unspecified: Secondary | ICD-10-CM | POA: Diagnosis not present

## 2023-09-23 DIAGNOSIS — R7303 Prediabetes: Secondary | ICD-10-CM

## 2023-09-23 DIAGNOSIS — B351 Tinea unguium: Secondary | ICD-10-CM

## 2023-09-23 DIAGNOSIS — I10 Essential (primary) hypertension: Secondary | ICD-10-CM

## 2023-09-23 LAB — LIPID PANEL
Cholesterol: 128 mg/dL (ref 0–200)
HDL: 48.4 mg/dL (ref >39.00)
LDL Cholesterol: 61 mg/dL (ref 0–99)
NonHDL: 79.82
Total CHOL/HDL Ratio: 3
Triglycerides: 94 mg/dL (ref 0.0–149.0)
VLDL: 18.8 mg/dL (ref 0.0–40.0)

## 2023-09-23 LAB — COMPREHENSIVE METABOLIC PANEL WITH GFR
ALT: 21 U/L (ref 0–53)
AST: 20 U/L (ref 0–37)
Albumin: 4.4 g/dL (ref 3.5–5.2)
Alkaline Phosphatase: 80 U/L (ref 39–117)
BUN: 23 mg/dL (ref 6–23)
CO2: 29 meq/L (ref 19–32)
Calcium: 9.4 mg/dL (ref 8.4–10.5)
Chloride: 104 meq/L (ref 96–112)
Creatinine, Ser: 0.91 mg/dL (ref 0.40–1.50)
GFR: 84.78 mL/min (ref >60.00)
Glucose, Bld: 120 mg/dL — ABNORMAL HIGH (ref 70–99)
Potassium: 4.8 meq/L (ref 3.5–5.1)
Sodium: 138 meq/L (ref 135–145)
Total Bilirubin: 1.4 mg/dL — ABNORMAL HIGH (ref 0.2–1.2)
Total Protein: 6.7 g/dL (ref 6.0–8.3)

## 2023-09-23 LAB — HEMOGLOBIN A1C: Hgb A1c MFr Bld: 6.5 % (ref 4.6–6.5)

## 2023-09-23 NOTE — Progress Notes (Signed)
 Established patient visit   Patient: Steven Larsen   DOB: 09/24/1950   73 y.o. Male  MRN: [id number] Visit Date: 09/23/2023  Today's healthcare provider: Manuelita Flatness, PA-C   Cc. Chronic care follow up  Subjective     Discussed the use of AI scribe software for clinical note transcription with the patient, who gave verbal consent to proceed.  History of Present Illness   The patient presents for a follow-up visit to monitor blood pressure, cholesterol, and prediabetes. They are accompanied by their partner.  They have been consistently taking their cholesterol medication since the last visit, with a previous LDL level of 140. They hope for improvement with adherence to medication. They are actively managing prediabetes with diet and exercise, walking two to three miles daily and monitoring carbohydrate intake. Their previous A1c was 6.0, and they are concerned about progression to diabetes. They have a fungal infection in their toenails, unresponsive to over-the-counter treatments.   Hypertension, follow-up  BP Readings from Last 3 Encounters:  09/23/23 131/73  01/09/23 130/66  11/25/22 138/82   Wt Readings from Last 3 Encounters:  09/23/23 218 lb 9.6 oz (99.2 kg)  01/09/23 228 lb 12.8 oz (103.8 kg)  11/25/22 229 lb 3.2 oz (104 kg)       Pertinent labs Lab Results  Component Value Date   CHOL 215 (H) 11/25/2022   HDL 52.30 11/25/2022   LDLCALC 140 (H) 11/25/2022   TRIG 117.0 11/25/2022   CHOLHDL 4 11/25/2022   Lab Results  Component Value Date   NA 138 11/25/2022   K 4.8 11/25/2022   CREATININE 0.84 11/25/2022   GFRNONAA >60 10/12/2018   GLUCOSE 110 (H) 11/25/2022   TSH 2.860 12/29/2017     The 10-year ASCVD risk score (Arnett DK, et al., 2019) is: 23.4%  ---------------------------------------------------------------------------------------------------   Medications: Outpatient Medications Prior to Visit  Medication Sig   acetaminophen   (TYLENOL ) 500 MG tablet Take 500 mg by mouth as needed.   aspirin  EC 81 MG tablet Take 81 mg by mouth daily.    atorvastatin  (LIPITOR ) 80 MG tablet TAKE 1 TABLET BY MOUTH DAILY   metoprolol  succinate (TOPROL -XL) 50 MG 24 hr tablet TAKE 1 TABLET BY MOUTH EVERY DAY   nitroGLYCERIN  (NITROSTAT ) 0.4 MG SL tablet Place 1 tablet (0.4 mg total) under the tongue every 5 (five) minutes as needed.   OVER THE COUNTER MEDICATION Take 1 capsule by mouth daily. Super Beta Prostate    OVER THE COUNTER MEDICATION daily.  Super Beets Daily harvest once a day   No facility-administered medications prior to visit.    Review of Systems  Constitutional:  Negative for fatigue and fever.  Respiratory:  Negative for cough and shortness of breath.   Cardiovascular:  Negative for chest pain, palpitations and leg swelling.  Neurological:  Negative for dizziness and headaches.       Objective    BP 131/73   Pulse 62   Ht 5' 10.5 (1.791 m)   Wt 218 lb 9.6 oz (99.2 kg)   BMI 30.92 kg/m    Physical Exam Constitutional:      General: He is awake.     Appearance: He is well-developed.  HENT:     Head: Normocephalic.  Eyes:     Conjunctiva/sclera: Conjunctivae normal.  Cardiovascular:     Rate and Rhythm: Normal rate and regular rhythm.     Heart sounds: Normal heart sounds.  Pulmonary:     Effort:  Pulmonary effort is normal.     Breath sounds: Normal breath sounds.  Feet:     Left foot:     Toenail Condition: Left toenails are abnormally thick. Fungal disease present. Skin:    General: Skin is warm.  Neurological:     Mental Status: He is alert and oriented to person, place, and time.  Psychiatric:        Attention and Perception: Attention normal.        Mood and Affect: Mood normal.        Speech: Speech normal.        Behavior: Behavior is cooperative.     No results found for any visits on 09/23/23.  Assessment & Plan    Pure hypercholesterolemia Assessment & Plan: Managed with  atorvastatin  80 mg Repeat fasting lipids  Orders: -     Lipid panel  Primary hypertension Assessment & Plan: Chronic, well controlled Cont metop 50 mg  Check cmp F/u 6 mo  Orders: -     Comprehensive metabolic panel with GFR  Prediabetes Assessment & Plan: Discussed diet/exercise/lifestyle changes Repeat a1c  Orders: -     Hemoglobin A1c  Onychomycosis  Discussed tx with terbinafine. Pt declines.  Return in about 6 months (around 03/25/2024) for chronic conditions.       Manuelita Flatness, PA-C  [facility] [phone] (phone) [phone] (fax)  [facility]

## 2023-09-23 NOTE — Assessment & Plan Note (Signed)
 Discussed diet/exercise/lifestyle changes Repeat a1c

## 2023-09-23 NOTE — Assessment & Plan Note (Signed)
 Chronic, well controlled Cont metop 50 mg  Check cmp F/u 6 mo

## 2023-09-23 NOTE — Assessment & Plan Note (Signed)
 Managed with atorvastatin  80 mg Repeat fasting lipids

## 2023-09-24 ENCOUNTER — Ambulatory Visit: Payer: Self-pay | Admitting: Physician Assistant

## 2023-09-29 ENCOUNTER — Other Ambulatory Visit: Payer: Self-pay | Admitting: Internal Medicine

## 2023-11-24 ENCOUNTER — Telehealth: Payer: Self-pay | Admitting: Internal Medicine

## 2023-11-24 NOTE — Telephone Encounter (Signed)
 Attempted return phone call and left voicemail message to contact office at [phone].

## 2023-11-24 NOTE — Telephone Encounter (Signed)
 Pt complains of severe nausea. Says the last time he experienced nausea like this, was when he had a widow maker heart attack. Please advise.

## 2023-11-25 NOTE — Telephone Encounter (Signed)
 Spoke with patient's wife. Patient had indigestion/nausea yesterday that felt similar to what preceded his 2019 stent.   Wife states patient feels better, per patient report -- patient states may have been related to a salad a restaurant the evening prior. Patient is unavailable to speak with and does not answer his phone while working. She will try to reach him when he is back in the office.   Offered an appt today at 2:45pm with CANDIE Ferrier PA. She will check with patient. Informed her that I cannot guarantee this appt time when they call back.

## 2023-12-21 ENCOUNTER — Ambulatory Visit: Attending: Internal Medicine | Admitting: Internal Medicine

## 2023-12-21 ENCOUNTER — Other Ambulatory Visit: Payer: Self-pay | Admitting: Internal Medicine

## 2023-12-21 ENCOUNTER — Encounter: Payer: Self-pay | Admitting: Internal Medicine

## 2023-12-21 ENCOUNTER — Ambulatory Visit: Attending: Internal Medicine

## 2023-12-21 VITALS — BP 149/62 | HR 72 | Resp 16 | Ht 70.0 in | Wt 214.8 lb

## 2023-12-21 DIAGNOSIS — I351 Nonrheumatic aortic (valve) insufficiency: Secondary | ICD-10-CM | POA: Diagnosis not present

## 2023-12-21 DIAGNOSIS — I2510 Atherosclerotic heart disease of native coronary artery without angina pectoris: Secondary | ICD-10-CM

## 2023-12-21 DIAGNOSIS — I340 Nonrheumatic mitral (valve) insufficiency: Secondary | ICD-10-CM | POA: Diagnosis not present

## 2023-12-21 DIAGNOSIS — I498 Other specified cardiac arrhythmias: Secondary | ICD-10-CM

## 2023-12-21 DIAGNOSIS — I493 Ventricular premature depolarization: Secondary | ICD-10-CM

## 2023-12-21 DIAGNOSIS — I7121 Aneurysm of the ascending aorta, without rupture: Secondary | ICD-10-CM

## 2023-12-21 NOTE — Patient Instructions (Signed)
 Medication Instructions:  NO CHANGES  *If you need a refill on your cardiac medications before your next appointment, please call your pharmacy*  Testing/Procedures: Your physician has requested that you have an echocardiogram. Echocardiography is a painless test that uses sound waves to create images of your heart. It provides your doctor with information about the size and shape of your heart and how well your heart's chambers and valves are working. This procedure takes approximately one hour. There are no restrictions for this procedure. Please do NOT wear cologne, perfume, aftershave, or lotions (deodorant is allowed). Please arrive 15 minutes prior to your appointment time.  Please note: We ask at that you not bring children with you during ultrasound (echo/ vascular) testing. Due to room size and safety concerns, children are not allowed in the ultrasound rooms during exams. Our front office staff cannot provide observation of children in our lobby area while testing is being conducted. An adult accompanying a patient to their appointment will only be allowed in the ultrasound room at the discretion of the ultrasound technician under special circumstances. We apologize for any inconvenience.  ZIO XT- Long Term Monitor Instructions  Your physician has requested you wear a ZIO patch monitor for 14 days.  This is a single patch monitor. [employer] supplies one patch monitor per enrollment. Additional stickers are not available. Please do not apply patch if you will be having a Nuclear Stress Test,  Echocardiogram, Cardiac CT, MRI, or Chest Xray during the period you would be wearing the  monitor. The patch cannot be worn during these tests. You cannot remove and re-apply the  ZIO XT patch monitor.  Your ZIO patch monitor will be mailed 3 day USPS to your address on file. It may take 3-5 days  to receive your monitor after you have been enrolled.  Once you have received your monitor, please  review the enclosed instructions. Your monitor  has already been registered assigning a specific monitor serial # to you.  Billing and Patient Assistance Program Information  We have supplied [employer] with any of your insurance information on file for billing purposes. [employer] offers a sliding scale Patient Assistance Program for patients that do not have  insurance, or whose insurance does not completely cover the cost of the ZIO monitor.  You must apply for the Patient Assistance Program to qualify for this discounted rate.  To apply, please call [employer] at [phone], select option 4, select option 2, ask to apply for  Patient Assistance Program. Meredeth will ask your household income, and how many people  are in your household. They will quote your out-of-pocket cost based on that information.  [employer] will also be able to set up a 48-month, interest-free payment plan if needed.  Applying the monitor   Shave hair from upper left chest.  Hold abrader disc by orange tab. Rub abrader in 40 strokes over the upper left chest as  indicated in your monitor instructions.  Clean area with 4 enclosed alcohol pads. Let dry.  Apply patch as indicated in monitor instructions. Patch will be placed under collarbone on left  side of chest with arrow pointing upward.  Rub patch adhesive wings for 2 minutes. Remove white label marked 1. Remove the white  label marked 2. Rub patch adhesive wings for 2 additional minutes.  While looking in a mirror, press and release button in center of patch. A small green light will  flash 3-4 times. This will be your only indicator that the  monitor has been turned on.  Do not shower for the first 24 hours. You may shower after the first 24 hours.  Press the button if you feel a symptom. You will hear a small click. Record Date, Time and  Symptom in the Patient Logbook.  When you are ready to remove the patch, follow instructions on the last 2 pages of Patient   Logbook. Stick patch monitor onto the last page of Patient Logbook.  Place Patient Logbook in the blue and white box. Use locking tab on box and tape box closed  securely. The blue and white box has prepaid postage on it. Please place it in the mailbox as  soon as possible. Your physician should have your test results approximately 7 days after the  monitor has been mailed back to [facility].  Call [facility] Customer Care at [phone] if you have questions regarding  your ZIO XT patch monitor. Call them immediately if you see an orange light blinking on your  monitor.  If your monitor falls off in less than 4 days, contact our Monitor department at [phone].  If your monitor becomes loose or falls off after 4 days call [employer] at [phone] for  suggestions on securing your monitor   Follow-Up: At [facility], you and your health needs are our priority.  As part of our continuing mission to provide you with exceptional heart care, our providers are all part of one team.  This team includes your primary Cardiologist (physician) and Advanced Practice Providers or APPs (Physician Assistants and Nurse Practitioners) who all work together to provide you with the care you need, when you need it.  Your next appointment:    2 months with Dr. Mona or PA/NP for testing follow up  We recommend signing up for the patient portal called MyChart.  Sign up information is provided on this After Visit Summary.  MyChart is used to connect with patients for Virtual Visits (Telemedicine).  Patients are able to view lab/test results, encounter notes, upcoming appointments, etc.  Non-urgent messages can be sent to your provider as well.   To learn more about what you can do with MyChart, go to [email or web address].

## 2023-12-21 NOTE — Progress Notes (Unsigned)
 OFFICE CONSULT NOTE  Chief Complaint:  Follow-up  Primary Care Physician: Cyndi Shaver, PA-C (Inactive)  HPI:  Steven Larsen is a 73 y.o. male who is being seen today for the evaluation of exertional dyspnea, chest discomfort at the request of Dr. Avram. 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  Recently he was push mowing the yard and noted onset of chest tightness and burning which resolved at rest.  The symptoms then came back when he started working again.  This happened several times and then eventually stopped.  There was originally concern for possible reflux and he was seen by Dr. Enid with [facility].  Because of the exertional nature of his symptoms, he was concerned that this may represent unstable angina.  Steven Larsen, has not had any routine medical care over the past several years.  He does not currently take medications or carry any outstanding diagnoses.  Family history is not significant for early onset heart disease, mostly notable for cancer in his father.  He reports taking some reflux medications without any benefit.  Blood pressure today noted to be elevated in the upper 140s over 90s.  He is not known to have hypertension.  03/02/2018  Steven Larsen returns today for follow-up of heart cath.  When I saw him in the office he was complaining of symptoms concerning for unstable angina and he was referred directly for heart catheterization.  This was performed on 11/18/2017, and demonstrated single-vessel coronary disease with a 95% eccentric mid LAD stenosis after a second diagonal vessel.  LVEF was normal at 55%.  He underwent PCI to the LAD with excellent result.  Ultimately was switched to aspirin  and Brilinta .  Since then on follow-up he has been doing well.  His blood sugar was noted to be elevated and his  cholesterol has significantly improved to LDL of 55 on high potency statin.  He denies any shortness of breath or recurrent chest pain.  He is managed to lose about 20 pounds with significant dietary changes.  01/04/2020  Steven Larsen is seen today in follow-up.  Overall he continues to do well.  In the interim he was seen by Bruno Campi, PA-C who discontinued his Brilinta .  He remains on aspirin , statin and metoprolol .  He denies any chest pain or worsening shortness of breath.  Blood pressure is very well controlled.  Lipids last year were well controlled with an LDL of 60.  He is due for repeat lipids.  EKG today shows normal sinus rhythm.  He says he is also managed to lose several pounds.  01/09/2023  Steven Larsen is seen today in follow-up.  He continues to do well and says he denies any chest pain or shortness of breath.  He has not required any additional nitroglycerin .  He had lipids fairly recently which showed total cholesterol 215, HDL 52, triglycerides 117 and LDL 140.  His target LDL should be less than 70.  He says that he however had stopped taking his atorvastatin  for unknown reasons but subsequently restarted it.  He has not had repeat labs.  PMHx:  Past Medical History:  Diagnosis Date   CAD (coronary artery disease)    11/18/17 PCI/DES mLAD, normal EF   Colon polyps    High cholesterol    History of kidney stones  Hypertension    Internal hemorrhoid     Past Surgical History:  Procedure Laterality Date   COLONOSCOPY  01/2019   LEFT HEART CATH AND CORONARY ANGIOGRAPHY N/A 11/18/2017   Procedure: LEFT HEART CATH AND CORONARY ANGIOGRAPHY;  Surgeon: Burnard Debby LABOR, MD;  Location: [facility];  Service: Cardiovascular;  Laterality: N/A;    FAMHx:  Family History  Problem Relation Age of Onset   Osteoporosis Mother    Bipolar disorder Mother    Hypertension Mother    Bladder Cancer Father 24   Osteoporosis Sister    Hyperlipidemia Sister    Osteoporosis  Sister    Hyperlipidemia Sister    Other Sister        MAC   Hypothyroidism Sister    COPD Brother    Hypertension Brother    Colon cancer Paternal Grandmother    Stomach cancer Neg Hx    Esophageal cancer Neg Hx     SOCHx:   reports that he has never smoked. He has never used smokeless tobacco. He reports that he does not drink alcohol and does not use drugs.  ALLERGIES:  No Known Allergies  ROS: Pertinent items noted in HPI and remainder of comprehensive ROS otherwise negative.  HOME MEDS: Current Outpatient Medications on File Prior to Visit  Medication Sig Dispense Refill   acetaminophen  (TYLENOL ) 500 MG tablet Take 500 mg by mouth as needed.     aspirin  EC 81 MG tablet Take 81 mg by mouth daily.      atorvastatin  (LIPITOR ) 80 MG tablet TAKE 1 TABLET BY MOUTH DAILY 90 tablet 1   metoprolol  succinate (TOPROL -XL) 50 MG 24 hr tablet TAKE 1 TABLET BY MOUTH EVERY DAY 90 tablet 3   nitroGLYCERIN  (NITROSTAT ) 0.4 MG SL tablet Place 1 tablet (0.4 mg total) under the tongue every 5 (five) minutes as needed. 25 tablet 2   OVER THE COUNTER MEDICATION Take 1 capsule by mouth daily. Super Beta Prostate      OVER THE COUNTER MEDICATION daily.  Super Beets Daily harvest once a day     No current facility-administered medications on file prior to visit.    LABS/IMAGING: No results found for this or any previous visit (from the past 48 hours). No results found.  LIPID PANEL:    Component Value Date/Time   CHOL 128 09/23/2023 1122   CHOL 117 11/21/2021 1517   TRIG 94.0 09/23/2023 1122   HDL 48.40 09/23/2023 1122   HDL 46 11/21/2021 1517   CHOLHDL 3 09/23/2023 1122   VLDL 18.8 09/23/2023 1122   LDLCALC 61 09/23/2023 1122   LDLCALC 57 11/21/2021 1517    WEIGHTS: Wt Readings from Last 3 Encounters:  12/21/23 214 lb 12.8 oz (97.4 kg)  09/23/23 218 lb 9.6 oz (99.2 kg)  01/09/23 228 lb 12.8 oz (103.8 kg)    VITALS: BP (!) 149/62 (BP Location: Left Arm, Patient Position:  Sitting, Cuff Size: Large)   Pulse 72   Resp 16   Ht 5' 10 (1.778 m)   Wt 214 lb 12.8 oz (97.4 kg)   SpO2 98%   BMI 30.82 kg/m   EXAM: General appearance: alert and no distress Neck: no carotid bruit, no JVD and thyroid  not enlarged, symmetric, no tenderness/mass/nodules Lungs: clear to auscultation bilaterally Heart: regular rate and rhythm, S1, S2 normal, no murmur, click, rub or gallop Abdomen: soft, non-tender; bowel sounds normal; no masses,  no organomegaly and obese Extremities: extremities normal, atraumatic, no cyanosis or  edema Pulses: 2+ and symmetric Skin: Skin color, texture, turgor normal. No rashes or lesions Neurologic: Grossly normal Psych: Pleasant  EKG: Deferred  ASSESSMENT: Coronary artery disease status post PCI to the mid LAD (11/2017) Dyslipidemia, goal LDL less than 70 Hypertension  PLAN: 1.   Steven Larsen he is doing well now 5 years out from PCI.  He remains very active and still works in [employer].  He denies any chest pain or shortness of breath with exertion.  His blood pressure is well-controlled.  His lipids recently were quite elevated however he was off of his statin medication and subsequently restarted it.  He should go ahead and have r repeat labs which can have drawn through his PCP in a few months.  Further medication adjustments can be based on that.  Plan otherwise follow-up with me annually or sooner as necessary.  Vinie KYM Maxcy, MD, [facility], FACP  [place]  [facility]  Medical Director of the Advanced Lipid Disorders &  Cardiovascular Risk Reduction Clinic Diplomate of the American Board of Clinical Lipidology Attending Cardiologist  Direct Dial: [phone]  Fax: [phone]  Website:  [email or web address]  Vinie BROCKS Ebelyn Bohnet 12/21/2023, 1:28 PM

## 2023-12-21 NOTE — Progress Notes (Unsigned)
 Enrolled for [employer] to mail a ZIO XT long term holter monitor to the patients address on file.

## 2023-12-23 ENCOUNTER — Other Ambulatory Visit: Payer: Self-pay | Admitting: Internal Medicine

## 2024-01-07 ENCOUNTER — Other Ambulatory Visit: Payer: Self-pay | Admitting: Internal Medicine

## 2024-01-19 ENCOUNTER — Ambulatory Visit: Admitting: Internal Medicine

## 2024-01-20 ENCOUNTER — Ambulatory Visit: Payer: Self-pay | Admitting: Internal Medicine

## 2024-01-20 ENCOUNTER — Ambulatory Visit (HOSPITAL_COMMUNITY)
Admission: RE | Admit: 2024-01-20 | Discharge: 2024-01-20 | Disposition: A | Discharge status: Home / Self Care | Source: Ambulatory Visit | Attending: Cardiology | Admitting: Cardiology

## 2024-01-20 DIAGNOSIS — I2510 Atherosclerotic heart disease of native coronary artery without angina pectoris: Secondary | ICD-10-CM

## 2024-01-20 DIAGNOSIS — I340 Nonrheumatic mitral (valve) insufficiency: Secondary | ICD-10-CM | POA: Insufficient documentation

## 2024-01-20 DIAGNOSIS — I498 Other specified cardiac arrhythmias: Secondary | ICD-10-CM | POA: Diagnosis not present

## 2024-01-20 DIAGNOSIS — I351 Nonrheumatic aortic (valve) insufficiency: Secondary | ICD-10-CM | POA: Insufficient documentation

## 2024-01-20 DIAGNOSIS — I359 Nonrheumatic aortic valve disorder, unspecified: Secondary | ICD-10-CM

## 2024-01-20 DIAGNOSIS — I493 Ventricular premature depolarization: Secondary | ICD-10-CM | POA: Diagnosis not present

## 2024-01-20 LAB — ECHOCARDIOGRAM COMPLETE
Area-P 1/2: 4.19 cm2
S' Lateral: 4.02 cm

## 2024-01-27 ENCOUNTER — Telehealth: Payer: Self-pay | Admitting: Internal Medicine

## 2024-01-27 NOTE — Telephone Encounter (Signed)
 Spoke to patient recent echo results given.Advised no change since prior study.

## 2024-01-27 NOTE — Telephone Encounter (Signed)
 Patient is returning call for results. Please advise

## 2024-01-27 NOTE — Telephone Encounter (Signed)
 Spoke with patient about monitor results also   Steven JAYSON Maxcy, MD 01/20/2024  9:23 PM EST     Monitor shows runs of NSVT as well as frequent PVC's (as seen in the office) with bigeminy and burden of 14.4%.  This frequency can lead to heart failure over time, but fortunately, the echo showed his heart function was normal today.  I would advise trying to suppress the PVC's with medication. Would recommend starting mexilitine 150 mg BID. The main limiting side-effect is nausea or vomiting, so be aware of that if it occurs.   Dr. Maxcy     Patient would like to think about starting new med. Reiterated potential impact of not treating PVCs.

## 2024-02-16 NOTE — Telephone Encounter (Signed)
 Followed up with patient. He wishes to discuss medication/management options with MD at 03/01/24 appt

## 2024-03-01 ENCOUNTER — Encounter: Payer: Self-pay | Admitting: Internal Medicine

## 2024-03-01 ENCOUNTER — Ambulatory Visit: Attending: Internal Medicine | Admitting: Internal Medicine

## 2024-03-01 VITALS — BP 124/70 | HR 71 | Ht 70.5 in | Wt 214.5 lb

## 2024-03-01 DIAGNOSIS — I493 Ventricular premature depolarization: Secondary | ICD-10-CM | POA: Insufficient documentation

## 2024-03-01 DIAGNOSIS — I2510 Atherosclerotic heart disease of native coronary artery without angina pectoris: Secondary | ICD-10-CM

## 2024-03-01 DIAGNOSIS — I498 Other specified cardiac arrhythmias: Secondary | ICD-10-CM

## 2024-03-01 DIAGNOSIS — I7121 Aneurysm of the ascending aorta, without rupture: Secondary | ICD-10-CM | POA: Diagnosis present

## 2024-03-01 NOTE — Patient Instructions (Signed)
 Medication Instructions:  NO CHANGES  *If you need a refill on your cardiac medications before your next appointment, please call your pharmacy*  Follow-Up: At [facility], you and your health needs are our priority.  As part of our continuing mission to provide you with exceptional heart care, our providers are all part of one team.  This team includes your primary Cardiologist (physician) and Advanced Practice Providers or APPs (Physician Assistants and Nurse Practitioners) who all work together to provide you with the care you need, when you need it.  Your next appointment:    6 months with Dr. Mona   Referral to cardiac electrophysiology   We recommend signing up for the patient portal called MyChart.  Sign up information is provided on this After Visit Summary.  MyChart is used to connect with patients for Virtual Visits (Telemedicine).  Patients are able to view lab/test results, encounter notes, upcoming appointments, etc.  Non-urgent messages can be sent to your provider as well.   To learn more about what you can do with MyChart, go to [email or web address].   Other Instructions

## 2024-03-01 NOTE — Progress Notes (Unsigned)
 OFFICE CONSULT NOTE  Chief Complaint:  Follow-up  Primary Care Physician: Cyndi Shaver, PA-C (Inactive)  HPI:  Steven Larsen is a 73 y.o. male who is being seen today for the evaluation of exertional dyspnea, chest discomfort at the request of Dr. Avram. 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  Recently he was push mowing the yard and noted onset of chest tightness and burning which resolved at rest.  The symptoms then came back when he started working again.  This happened several times and then eventually stopped.  There was originally concern for possible reflux and he was seen by Dr. Enid with [facility].  Because of the exertional nature of his symptoms, he was concerned that this may represent unstable angina.  Steven Larsen, has not had any routine medical care over the past several years.  He does not currently take medications or carry any outstanding diagnoses.  Family history is not significant for early onset heart disease, mostly notable for cancer in his father.  He reports taking some reflux medications without any benefit.  Blood pressure today noted to be elevated in the upper 140s over 90s.  He is not known to have hypertension.  03/02/2018  Steven Larsen returns today for follow-up of heart cath.  When I saw him in the office he was complaining of symptoms concerning for unstable angina and he was referred directly for heart catheterization.  This was performed on 11/18/2017, and demonstrated single-vessel coronary disease with a 95% eccentric mid LAD stenosis after a second diagonal vessel.  LVEF was normal at 55%.  He underwent PCI to the LAD with excellent result.  Ultimately was switched to aspirin  and Brilinta .  Since then on follow-up he has been doing well.  His blood sugar was noted to be elevated and his  cholesterol has significantly improved to LDL of 55 on high potency statin.  He denies any shortness of breath or recurrent chest pain.  He is managed to lose about 20 pounds with significant dietary changes.  01/04/2020  Steven Larsen is seen today in follow-up.  Overall he continues to do well.  In the interim he was seen by Bruno Campi, PA-C who discontinued his Brilinta .  He remains on aspirin , statin and metoprolol .  He denies any chest pain or worsening shortness of breath.  Blood pressure is very well controlled.  Lipids last year were well controlled with an LDL of 60.  He is due for repeat lipids.  EKG today shows normal sinus rhythm.  He says he is also managed to lose several pounds.  01/09/2023  Steven Larsen is seen today in follow-up.  He continues to do well and says he denies any chest pain or shortness of breath.  He has not required any additional nitroglycerin .  He had lipids fairly recently which showed total cholesterol 215, HDL 52, triglycerides 117 and LDL 140.  His target LDL should be less than 70.  He says that he however had stopped taking his atorvastatin  for unknown reasons but subsequently restarted it.  He has not had repeat labs.  12/21/2023  Steven Larsen returns today for follow-up.  Apparently his wife called in recently as he was having some GI upset symptoms.  He says he did not think much of it but she was concerned as the  symptoms she thought were similar to his prior MI.  On EKG today however he is noted to have frequent PVCs in a quadrigeminy pattern.  He seems asymptomatic with that.  He has had some shortness of breath with exertion.  Blood pressure was little elevated at 149/62.  Lipid testing in July showing total cholesterol 128, triglycerides 94, HDL 48 and LDL 61.  PMHx:  Past Medical History:  Diagnosis Date   CAD (coronary artery disease)    11/18/17 PCI/DES mLAD, normal EF   Colon polyps    High cholesterol    History of kidney stones    Hypertension     Internal hemorrhoid     Past Surgical History:  Procedure Laterality Date   COLONOSCOPY  01/2019   LEFT HEART CATH AND CORONARY ANGIOGRAPHY N/A 11/18/2017   Procedure: LEFT HEART CATH AND CORONARY ANGIOGRAPHY;  Surgeon: Burnard Debby LABOR, MD;  Location: [facility];  Service: Cardiovascular;  Laterality: N/A;    FAMHx:  Family History  Problem Relation Age of Onset   Osteoporosis Mother    Bipolar disorder Mother    Hypertension Mother    Bladder Cancer Father 52   Osteoporosis Sister    Hyperlipidemia Sister    Osteoporosis Sister    Hyperlipidemia Sister    Other Sister        MAC   Hypothyroidism Sister    COPD Brother    Hypertension Brother    Colon cancer Paternal Grandmother    Stomach cancer Neg Hx    Esophageal cancer Neg Hx     SOCHx:   reports that he has never smoked. He has never used smokeless tobacco. He reports that he does not drink alcohol and does not use drugs.  ALLERGIES:  No Known Allergies  ROS: Pertinent items noted in HPI and remainder of comprehensive ROS otherwise negative.  HOME MEDS: Current Outpatient Medications on File Prior to Visit  Medication Sig Dispense Refill   acetaminophen  (TYLENOL ) 500 MG tablet Take 500 mg by mouth as needed.     aspirin  EC 81 MG tablet Take 81 mg by mouth daily.      atorvastatin  (LIPITOR ) 80 MG tablet TAKE 1 TABLET BY MOUTH DAILY 90 tablet 3   metoprolol  succinate (TOPROL -XL) 50 MG 24 hr tablet TAKE 1 TABLET BY MOUTH EVERY DAY 90 tablet 3   OVER THE COUNTER MEDICATION Take 1 capsule by mouth daily. Super Beta Prostate      OVER THE COUNTER MEDICATION daily.  Super Beets Daily harvest once a day     nitroGLYCERIN  (NITROSTAT ) 0.4 MG SL tablet Place 1 tablet (0.4 mg total) under the tongue every 5 (five) minutes as needed. (Patient not taking: Reported on 03/01/2024) 25 tablet 2   No current facility-administered medications on file prior to visit.    LABS/IMAGING: No results found for this or  any previous visit (from the past 48 hours). No results found.  LIPID PANEL:    Component Value Date/Time   CHOL 128 09/23/2023 1122   CHOL 117 11/21/2021 1517   TRIG 94.0 09/23/2023 1122   HDL 48.40 09/23/2023 1122   HDL 46 11/21/2021 1517   CHOLHDL 3 09/23/2023 1122   VLDL 18.8 09/23/2023 1122   LDLCALC 61 09/23/2023 1122   LDLCALC 57 11/21/2021 1517    WEIGHTS: Wt Readings from Last 3 Encounters:  03/01/24 214 lb 8 oz (97.3 kg)  12/21/23 214 lb 12.8 oz (97.4 kg)  09/23/23 218 lb 9.6 oz (  99.2 kg)    VITALS: BP 124/70 (BP Location: Left Arm, Patient Position: Sitting, Cuff Size: Large)   Pulse 71   Ht 5' 10.5 (1.791 m)   Wt 214 lb 8 oz (97.3 kg)   SpO2 96%   BMI 30.34 kg/m   EXAM: General appearance: alert and no distress Neck: no carotid bruit, no JVD and thyroid  not enlarged, symmetric, no tenderness/mass/nodules Lungs: clear to auscultation bilaterally Heart: regular rate and rhythm, S1, S2 normal, no murmur, click, rub or gallop Abdomen: soft, non-tender; bowel sounds normal; no masses,  no organomegaly and obese Extremities: extremities normal, atraumatic, no cyanosis or edema Pulses: 2+ and symmetric Skin: Skin color, texture, turgor normal. No rashes or lesions Neurologic: Grossly normal Psych: Pleasant  EKG:      ASSESSMENT: Ventricular bigeminy Coronary artery disease status post PCI to the mid LAD (11/2017) Dyslipidemia, goal LDL less than 70 Hypertension Mildly dilated aorta-40 mm  PLAN: 1.   Steven Larsen says he feels well but was noted to have ventricular bigeminy today.  He denies any chest pain.  He did have an echocardiogram last in 2024, which showed normal LVEF of 60 to 65% but mild to moderate MR and mild to moderate AI.  He also had a mildly dilated aortic root to 40 mm.  I would like to repeat an echo since it has been a year to reassess LV function and his valvular heart disease as well as his dilated aorta.  I also placed a 2-week  monitor to see if we can pick up on the PVCs that he is having and what his burden may be.  Plan follow-up afterwards.  Vinie KYM Maxcy, MD, [facility], FNLA, FACP  [place]  [facility]  Medical Director of the Advanced Lipid Disorders &  Cardiovascular Risk Reduction Clinic Diplomate of the American Board of Clinical Lipidology Attending Cardiologist  Direct Dial: [phone]  Fax: [phone]  Website:  [email or web address] Vinie JAYSON Maxcy 03/01/2024, 4:58 PM

## 2024-04-14 ENCOUNTER — Ambulatory Visit: Admitting: Cardiology

## 2024-04-14 NOTE — Progress Notes (Signed)
 " Electrophysiology Office Note:   Date:  04/16/2024  ID:  BONNY EGGER, DOB January 29, 1951, MRN [id number]  Primary Cardiologist: Vinie JAYSON Maxcy, MD Electrophysiologist: Fonda Kitty, MD      History of Present Illness:   Steven Larsen is a 74 y.o. male with h/o CAD s/p PCI, HTN, HLD, ascending aortic dilatation, moderate AI who is being seen today for evaluation of PVCs.  Discussed the use of AI scribe software for clinical note transcription with the patient, who gave verbal consent to proceed.  History of Present Illness Steven Larsen is a 74 year old male with premature ventricular contractions who presents for evaluation of heart rhythm abnormalities. He is accompanied by his wife. He was referred by Dr. Maxcy for evaluation of heart rhythm abnormalities.  He has been experiencing premature ventricular contractions (PVCs) with extra heartbeats originating from the bottom chamber of the heart occurring about 14% of the time. No symptoms such as dizziness, lightheadedness, shortness of breath, or chest pain have been present since 2019 when a stent was placed. He reports being able to perform daily activities without limitations.  He is currently taking metoprolol , 50 mg at night, and an 81 mg aspirin  every morning. He does not take atorvastatin  or any other medications aside from metoprolol .  No chest pain, heart pounding, fluttering, or lightheadedness. He feels well and is able to work daily if desired.    Review of systems complete and found to be negative unless listed in HPI.   EP Information / Studies Reviewed:    ECG 12/21/23:      Zio 12/2023:   Echo 01/20/24:  1. Left ventricular ejection fraction, by estimation, is 55 to 60%. The  left ventricle has normal function. The left ventricle has no regional  wall motion abnormalities. Left ventricular diastolic parameters were  normal.   2. Right ventricular systolic function is normal. The right ventricular  size is  normal.   3. The mitral valve is degenerative. Trivial mitral valve regurgitation.  No evidence of mitral stenosis.   4. The aortic valve is normal in structure. Aortic valve regurgitation is  mild. No aortic stenosis is present.   5. Aortic dilatation noted. There is mild dilatation of the ascending  aorta, measuring 42 mm.   6. The inferior vena cava is normal in size with greater than 50%  respiratory variability, suggesting right atrial pressure of 3 mmHg.   Physical Exam:   VS:  BP (!) 140/72 (BP Location: Left Arm, Patient Position: Sitting, Cuff Size: Normal)   Pulse 76   Ht 5' 10.5 (1.791 m)   Wt 218 lb 6.4 oz (99.1 kg)   SpO2 96%   BMI 30.89 kg/m    Wt Readings from Last 3 Encounters:  04/15/24 218 lb 6.4 oz (99.1 kg)  03/01/24 214 lb 8 oz (97.3 kg)  12/21/23 214 lb 12.8 oz (97.4 kg)     General: Well developed, in no acute distress.  Neck: No JVD.  Cardiac: Normal rate, regular rhythm.  Resp: Normal work of breathing.  Ext: No edema.  Neuro: No gross focal deficits.  Psych: Normal affect.   ASSESSMENT AND PLAN:   Assessment and Plan Assessment & Plan Frequent premature ventricular contractions: LBBB, V3 transition, left inferior axis. Likely outflow tract.  - He is asymptomatic with a normal LVEF and no signs or symptoms of clinical heart failure. He would like to avoid additional medications. He is concerned about side effects. At this time,  risks of anti-arrhythmic therapy or catheter ablation likely outweigh any benefits. Recommend annual echo to monitor LVEF.  - Continue metoprolol  XL 50mg  daily.  - Monitor for symptoms or development of CHF.    Follow up with EP Team as needed.   Signed, Fonda Kitty, MD  "

## 2024-04-15 ENCOUNTER — Encounter: Payer: Self-pay | Admitting: Cardiology

## 2024-04-15 ENCOUNTER — Ambulatory Visit: Attending: Cardiology | Admitting: Cardiology

## 2024-04-15 VITALS — BP 140/72 | HR 76 | Ht 70.5 in | Wt 218.4 lb

## 2024-04-15 DIAGNOSIS — I493 Ventricular premature depolarization: Secondary | ICD-10-CM | POA: Diagnosis not present

## 2024-04-15 NOTE — Patient Instructions (Signed)
